# Patient Record
Sex: Male | Born: 1945 | Race: White | Hispanic: No | Marital: Married | Smoking: Former smoker
Health system: Southern US, Community
[De-identification: ages and names within clinical notes are randomized; demographics above are authoritative.]

## PROBLEM LIST (undated history)

## (undated) DIAGNOSIS — I10 Essential (primary) hypertension: Secondary | ICD-10-CM

## (undated) DIAGNOSIS — C61 Malignant neoplasm of prostate: Secondary | ICD-10-CM

## (undated) DIAGNOSIS — R972 Elevated prostate specific antigen [PSA]: Secondary | ICD-10-CM

## (undated) DIAGNOSIS — M19019 Primary osteoarthritis, unspecified shoulder: Secondary | ICD-10-CM

## (undated) DIAGNOSIS — K219 Gastro-esophageal reflux disease without esophagitis: Secondary | ICD-10-CM

## (undated) DIAGNOSIS — H9319 Tinnitus, unspecified ear: Secondary | ICD-10-CM

## (undated) DIAGNOSIS — K635 Polyp of colon: Secondary | ICD-10-CM

## (undated) DIAGNOSIS — Z87442 Personal history of urinary calculi: Secondary | ICD-10-CM

## (undated) HISTORY — PX: HERNIA REPAIR: SHX51

## (undated) HISTORY — PX: CYSTOSCOPY W/ STONE MANIPULATION: SHX1427

## (undated) HISTORY — DX: Gastro-esophageal reflux disease without esophagitis: K21.9

## (undated) HISTORY — PX: BACK SURGERY: SHX140

## (undated) HISTORY — PX: APPENDECTOMY: SHX54

## (undated) HISTORY — PX: OTHER SURGICAL HISTORY: SHX169

## (undated) HISTORY — DX: Essential (primary) hypertension: I10

## (undated) HISTORY — DX: Polyp of colon: K63.5

## (undated) HISTORY — DX: Tinnitus, unspecified ear: H93.19

---

## 1998-03-02 ENCOUNTER — Encounter: Payer: Self-pay | Admitting: Neurosurgery

## 1998-03-02 ENCOUNTER — Ambulatory Visit (HOSPITAL_COMMUNITY): Admission: RE | Admit: 1998-03-02 | Discharge: 1998-03-02 | Payer: Self-pay | Admitting: Neurosurgery

## 1998-11-11 ENCOUNTER — Ambulatory Visit (HOSPITAL_BASED_OUTPATIENT_CLINIC_OR_DEPARTMENT_OTHER): Admission: RE | Admit: 1998-11-11 | Discharge: 1998-11-11 | Payer: Self-pay | Admitting: General Surgery

## 2004-06-22 ENCOUNTER — Ambulatory Visit (HOSPITAL_COMMUNITY): Admission: RE | Admit: 2004-06-22 | Discharge: 2004-06-23 | Payer: Self-pay | Admitting: Orthopaedic Surgery

## 2006-02-15 IMAGING — CR DG CHEST 2V
2 series · 2 of 2 positions shown · non-contrast
Comparison: none

CLINICAL DATA: Severe back pain.  Preop.  Hypertension. Non-smoker. 
 CHEST - TWO VIEW:
 PA and lateral views of the chest are made and show degenerative hypertrophic spurs of the mid thoracic spine.  There is no evidence of fracture or metastatic disease.  The heart, mediastinum and lungs appear clear.

[view not recorded (1 of 2)]
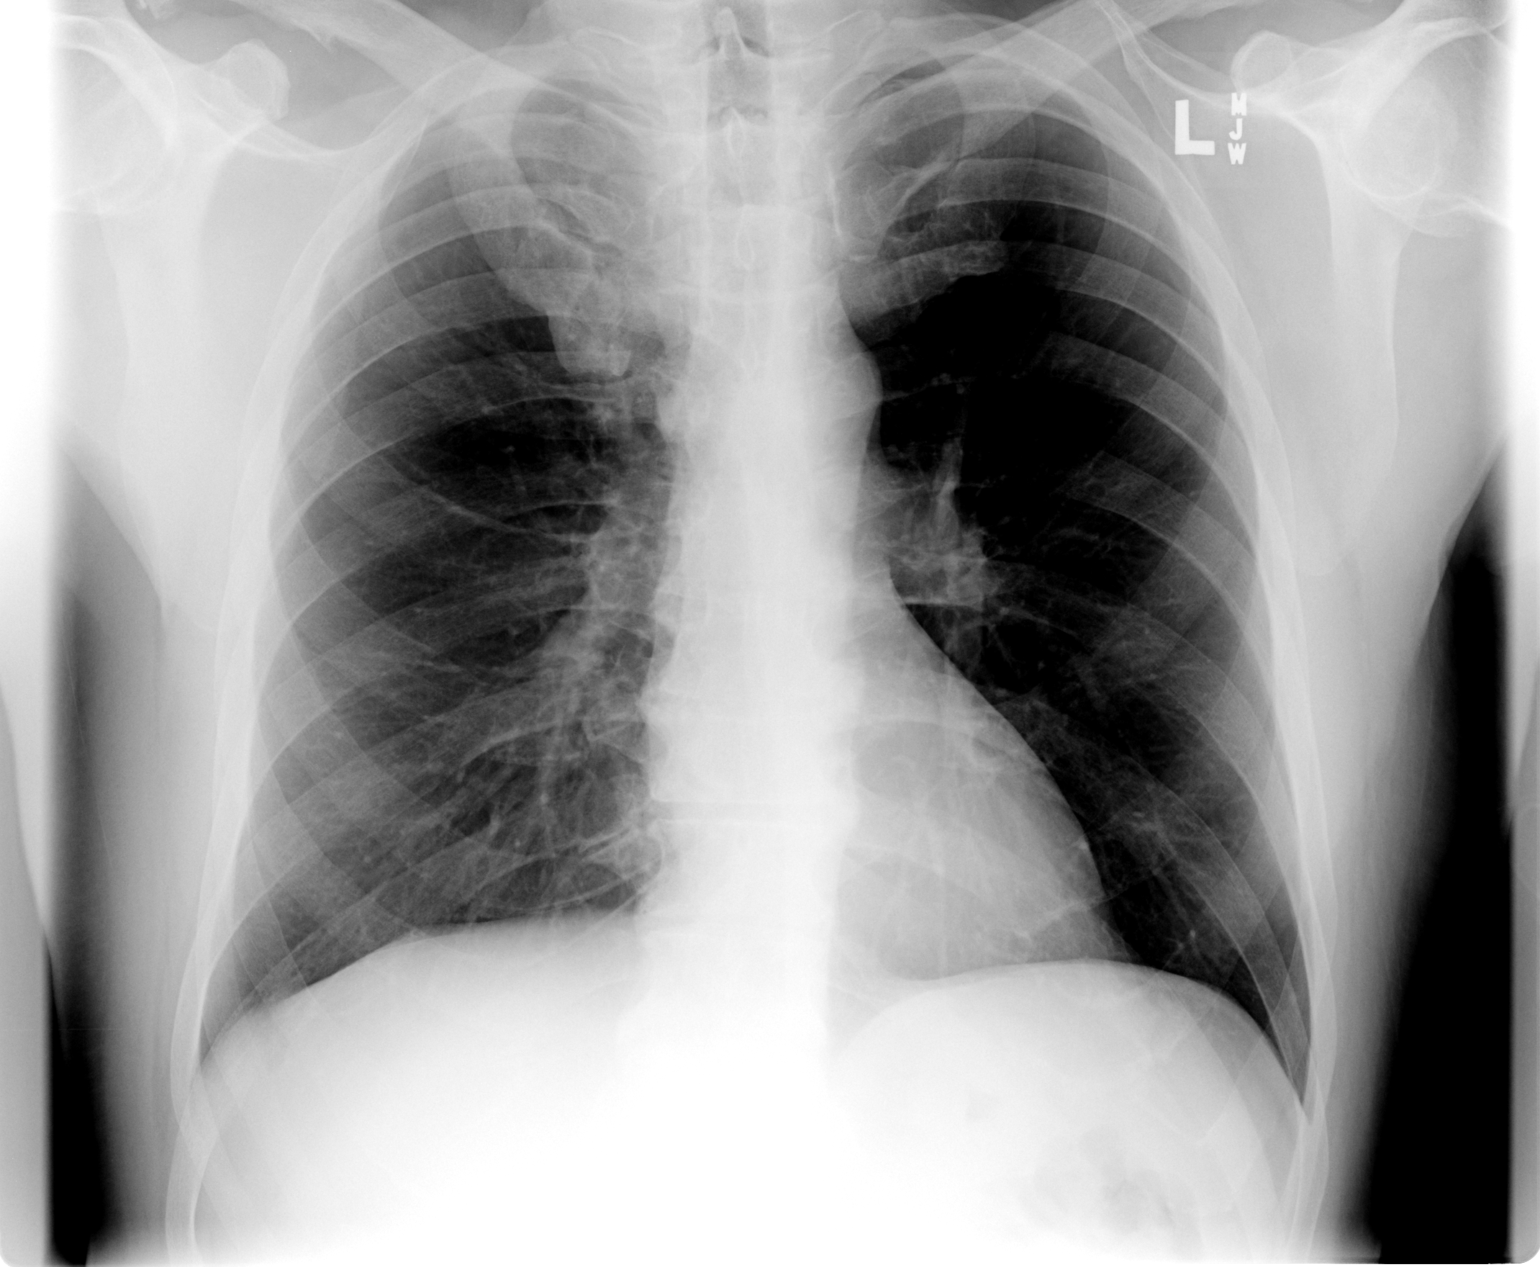

[view not recorded (2 of 2)]
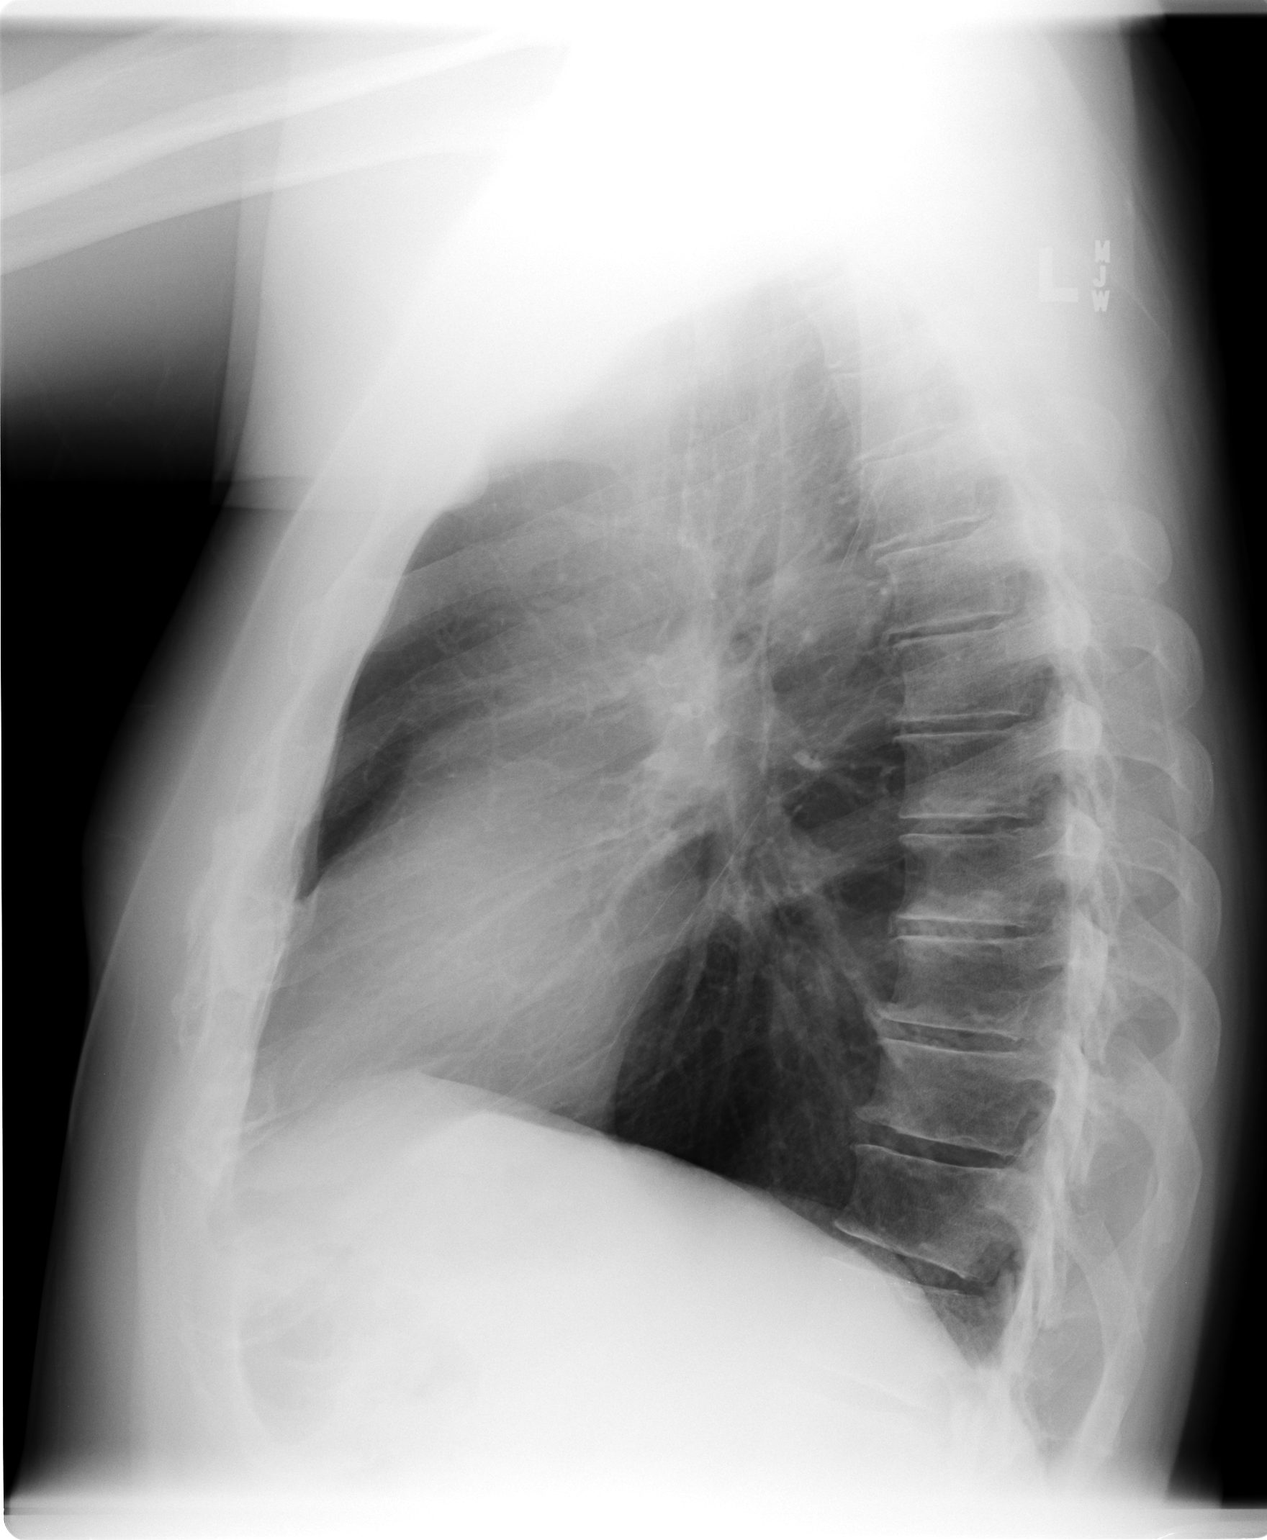

[2 of 2 positions shown; findings below may reference images not displayed]

IMPRESSION: No evidence of active disease in the chest.  Degenerative hypertrophic spurs mid and lower thoracic spine.

## 2006-11-14 ENCOUNTER — Encounter: Admission: RE | Admit: 2006-11-14 | Discharge: 2006-11-14 | Payer: Self-pay | Admitting: Orthopaedic Surgery

## 2006-11-19 ENCOUNTER — Ambulatory Visit (HOSPITAL_COMMUNITY): Admission: RE | Admit: 2006-11-19 | Discharge: 2006-11-20 | Payer: Self-pay | Admitting: Orthopaedic Surgery

## 2008-07-14 IMAGING — CR DG LUMBAR SPINE 1V
1 series · 1 of 1 positions shown · non-contrast
Comparison: none

CLINICAL DATA: Recurrent disk herniation L5-S1.
 LUMBAR SPINE ? 1 VIEW:

[view not recorded]
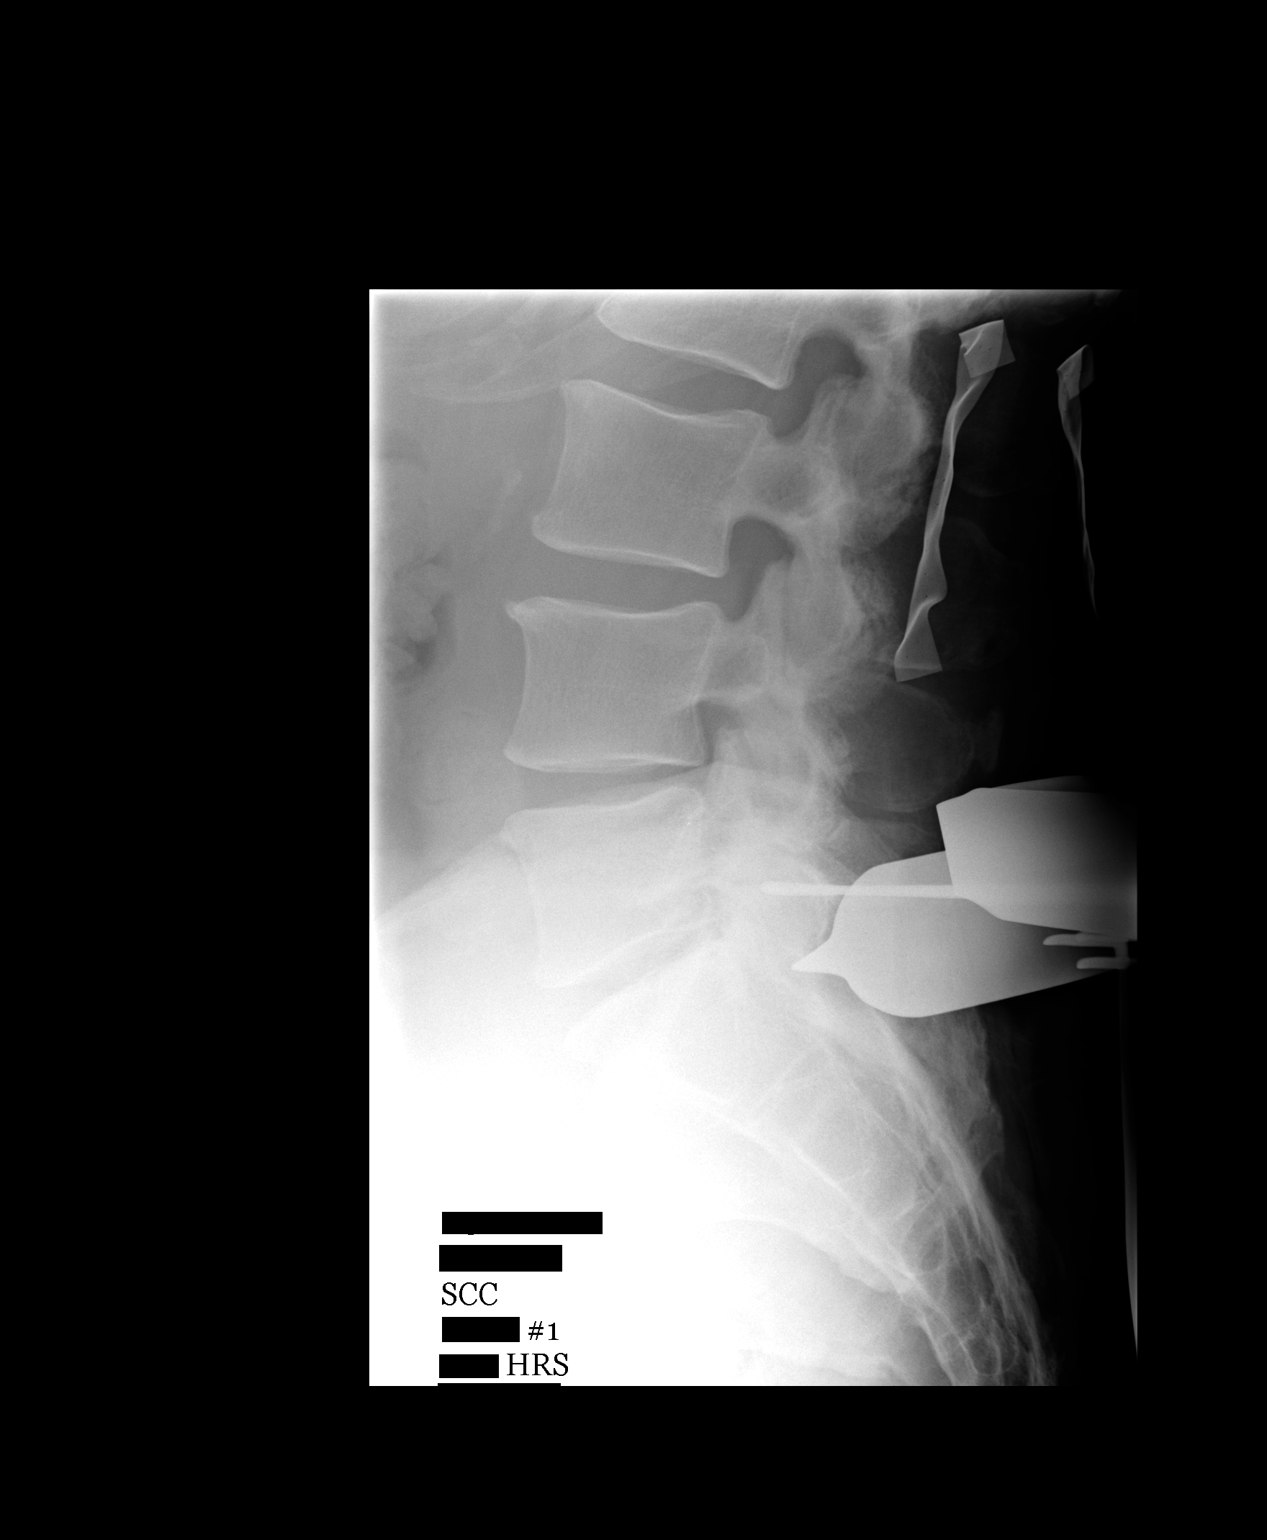

[1 of 1 positions shown; findings below may reference images not displayed]

FINDINGS: Tissue spreaders are in place posteriorly with the probe projected at the L5-S1 disk space level.
IMPRESSION: As discussed above.

## 2010-10-11 NOTE — Op Note (Signed)
Devin Duke, Devin Duke              ACCOUNT NO.:  [id number]   MEDICAL RECORD NO.:  [id number]          PATIENT TYPE:  OIB   LOCATION:  2550                         FACILITY:  [facility]   PHYSICIAN:  Mark C. Ophelia Charter, M.D.    DATE OF BIRTH:  09-06-1945   DATE OF PROCEDURE:  11/19/2006  DATE OF DISCHARGE:                               OPERATIVE REPORT   PREOPERATIVE DIAGNOSIS:  Recurrent L5-S1 herniated nucleus pulposus.   POSTOPERATIVE DIAGNOSIS:  Recurrent L5-S1 herniated nucleus pulposus.   PROCEDURE:  L5-S1 left microdiskectomy for recurrent herniated nucleus  pulposus.   SURGEON:  Annell Greening, MD   ASSISTANT:  Patrick Jupiter, RNFA.   ANESTHESIA:  GOT plus Marcaine skin local.   ESTIMATED BLOOD LOSS:  Minimal.   BRIEF HISTORY:  A 65 year old male who had microdiskectomy in the past;  did well until several weeks ago when he developed severe, excruciating  recurrent pain not responsive to narcotics and inability to sit.  He  also had great difficulty walking, with MRI demonstrating a large  extruded fragment.   PROCEDURE:  After induction of general anesthesia, the patient placed on  the [place] frame.  Standard prepping and draping and time-out  procedure.  Preoperative Ancef prophylaxis.  Sterile skin mark on the  old incision; then Betadine, Vy-Drape and laminectomy sheet.  Incision  was made.  The subcutaneous tissue was sharply dissected with  subperiosteal dissection.  Inferior aspect of the lamina was identified;  a small amount of bone was removed and a Penfield #4 was slipped just  above the level of this; confirmed with cross-table lateral x-ray.  Scar  tissue with the Carlen curettes were cleaned off of the lateral wall,  removing somewhat overhanging facet degenerative changes.  After  removal, the nerve root was identified, it was scarred down into the  foramina; the foramina was enlarged.  The nerve tissue was peeled off  and since the nerve root mobilized slightly, a  large fragment was  identified -- which was freed and was grasped with a pituitary and  gently teased out.  Then the entire piece came out as one large L-shaped  fragment of disk; it was 2 x 5 cm.  After irrigation, a pass was made to  the disk and micro pituitary down and straight.  The foramen was  enlarged, scar tissue peeled off the nerve root and the nerve root was  freed.  No fragments throughout the foramina.  After irrigation with  saline solution then layered closure:  0 Vicryl to the deep fascia, 2-0  Vicryl to subcutaneous tissue, 4-0 Vicryl subcuticular closure.  Tincture of Benzoin and Steri-Strips and postop dressing.  Instrument  count and needle count was correct.     Mark C. Ophelia Charter, M.D.  Electronically Signed    MCY/MEDQ  D:  11/19/2006  T:  11/19/2006  Job:  [id number]

## 2010-10-14 NOTE — Op Note (Signed)
Devin Duke, Devin Duke              ACCOUNT NO.:  [id number]   MEDICAL RECORD NO.:  [id number]          PATIENT TYPE:  OIB   LOCATION:  2899                         FACILITY:  [facility]   PHYSICIAN:  Mark C. Ophelia Charter, M.D.    DATE OF BIRTH:  10/20/1945   DATE OF PROCEDURE:  06/22/2004  DATE OF DISCHARGE:                                 OPERATIVE REPORT   ANESTHESIA:  GOT with Marcaine skin local.   ESTIMATED BLOOD LOSS:  Less than 100 mL.   SURGEON:  Mark C. Ophelia Charter, M.D.   ASSISTANT:  Melvia Heaps, R.N.F.A.   DRAINS:  None.   BRIEF HISTORY:  65 year old male with acute left L5-S1 radiculopathy with  significant pain and difficulty ambulating, paresthesias, and progressive  increasing pain, not relieved or tolerated despite progressive narcotic  intake progressing from Vicodin to Percocet.  MRI scan showed a large L5-S1  HNP on the left with nerve root compression.   PROCEDURE:  After induction of general anesthesia, preoperative Ancef  prophylaxis, the back was prepped with DuraPrep, the area spread with  towels, and Betadine ViDrape was applied.  Laminectomy sheet was applied.  An incision was made starting at the L5-S1 level after a spinal needle was  placed at the expected level based on palpable landmarks and confirmed with  cross table lateral x-ray.  An incision was made at L5-S1 and subperiosteal  dissection of the lamina was performed, Taylor retractor was placed  laterally.  A small laminotomy was performed at L5.  Some lateral bone was  removed out to the level of the pedicle.  There was immediate visualization  of the L5 nerve root which was significantly displaced dorsally.  The nerve  root was significantly compressed and disc immediately  underneath it which  was ruptured and scarred down to the nerve root with a film of fibrous  tissue.  This was gently dissected away superiorly and ventrally with black  nerve hook.  Finally, a small piece of the disc was identified and  then the  capsule was gently broken loose laterally and the plane between the nerve  root and the encapsulated disc material was separated.  A large disc  fragment was removed with some remaining small fragments still left in the  pocket.  The wall of the capsule was removed.  The annulus was incised.  Passes were made and one remaining large chunk of nucleus material was  removed.  Foramina was freed, there was no remaining fragments.  Additional  passes were made with micropituitaries up and down with Epstein curet and  also a hockey stick was used for palpation.  There were no remaining  fragments present.  After irrigation with saline solution, Taylor retractor  was removed from the lateral position, and the fascia was closed with 0  Vicryl suture, 2-0 Vicryl in the  subcutaneous tissue, 4-0 Vicryl subcuticular skin closure, tincture of  Benzoin and Steri-Strips.  Marcaine infiltration.  4 by 4s and tape were  applied.  The patient tolerated the procedure well and was transported to  the recovery room in stable condition.  MCY/MEDQ  D:  06/22/2004  T:  06/22/2004  Job:  [id number]

## 2011-03-15 LAB — CBC
Hemoglobin: 16.4
MCHC: 33.7
MCV: 94.2
RBC: 5.17

## 2011-03-15 LAB — URINALYSIS, ROUTINE W REFLEX MICROSCOPIC
Ketones, ur: NEGATIVE
Nitrite: NEGATIVE
Specific Gravity, Urine: 1.024
pH: 6.5

## 2011-03-15 LAB — COMPREHENSIVE METABOLIC PANEL
BUN: 22
CO2: 31
Calcium: 9.6
Creatinine, Ser: 0.84
GFR calc non Af Amer: >60
Glucose, Bld: 126 — ABNORMAL HIGH

## 2011-03-15 LAB — BODY FLUID CULTURE

## 2014-01-25 ENCOUNTER — Encounter: Payer: Self-pay | Admitting: *Deleted

## 2014-01-25 DIAGNOSIS — I10 Essential (primary) hypertension: Secondary | ICD-10-CM | POA: Insufficient documentation

## 2015-08-11 ENCOUNTER — Other Ambulatory Visit: Payer: Self-pay | Admitting: Gastroenterology

## 2015-09-08 ENCOUNTER — Encounter (HOSPITAL_COMMUNITY): Payer: Self-pay | Admitting: *Deleted

## 2015-09-20 ENCOUNTER — Encounter (HOSPITAL_COMMUNITY): Payer: Self-pay

## 2015-09-20 ENCOUNTER — Ambulatory Visit (HOSPITAL_COMMUNITY): Payer: Medicare Other | Admitting: Certified Registered Nurse Anesthetist

## 2015-09-20 ENCOUNTER — Ambulatory Visit (HOSPITAL_COMMUNITY)
Admission: RE | Admit: 2015-09-20 | Discharge: 2015-09-20 | Disposition: A | Discharge status: Home / Self Care | Payer: Medicare Other | Source: Ambulatory Visit | Attending: Gastroenterology | Admitting: Gastroenterology

## 2015-09-20 ENCOUNTER — Encounter (HOSPITAL_COMMUNITY)
Admission: RE | Disposition: A | Discharge status: Home / Self Care | Payer: Self-pay | Source: Ambulatory Visit | Attending: Gastroenterology

## 2015-09-20 DIAGNOSIS — D125 Benign neoplasm of sigmoid colon: Secondary | ICD-10-CM | POA: Insufficient documentation

## 2015-09-20 DIAGNOSIS — K635 Polyp of colon: Secondary | ICD-10-CM | POA: Insufficient documentation

## 2015-09-20 DIAGNOSIS — Z1211 Encounter for screening for malignant neoplasm of colon: Secondary | ICD-10-CM | POA: Diagnosis present

## 2015-09-20 DIAGNOSIS — Z87891 Personal history of nicotine dependence: Secondary | ICD-10-CM | POA: Diagnosis not present

## 2015-09-20 DIAGNOSIS — Z8719 Personal history of other diseases of the digestive system: Secondary | ICD-10-CM | POA: Insufficient documentation

## 2015-09-20 DIAGNOSIS — D122 Benign neoplasm of ascending colon: Secondary | ICD-10-CM | POA: Diagnosis not present

## 2015-09-20 DIAGNOSIS — I10 Essential (primary) hypertension: Secondary | ICD-10-CM | POA: Diagnosis not present

## 2015-09-20 HISTORY — DX: Elevated prostate specific antigen (PSA): R97.20

## 2015-09-20 HISTORY — PX: COLONOSCOPY WITH PROPOFOL: SHX5780

## 2015-09-20 HISTORY — DX: Primary osteoarthritis, unspecified shoulder: M19.019

## 2015-09-20 HISTORY — DX: Personal history of urinary calculi: Z87.442

## 2015-09-20 SURGERY — COLONOSCOPY WITH PROPOFOL
Anesthesia: Monitor Anesthesia Care

## 2015-09-20 MED ORDER — LIDOCAINE HCL (CARDIAC) 20 MG/ML IV SOLN
INTRAVENOUS | Status: AC
  Filled 2015-09-20: qty 5

## 2015-09-20 MED ORDER — ONDANSETRON HCL 4 MG/2ML IJ SOLN
INTRAMUSCULAR | Status: AC
  Filled 2015-09-20: qty 2

## 2015-09-20 MED ORDER — PROPOFOL 500 MG/50ML IV EMUL
INTRAVENOUS | Status: DC | PRN
  Administered 2015-09-20: 75 ug/kg/min via INTRAVENOUS

## 2015-09-20 MED ORDER — LACTATED RINGERS IV SOLN
INTRAVENOUS | Status: DC
  Administered 2015-09-20: 1000 mL via INTRAVENOUS

## 2015-09-20 MED ORDER — FENTANYL CITRATE (PF) 100 MCG/2ML IJ SOLN: 25.0000 ug | INTRAMUSCULAR | Status: DC | PRN

## 2015-09-20 MED ORDER — PROPOFOL 10 MG/ML IV BOLUS
INTRAVENOUS | Status: AC
  Filled 2015-09-20: qty 60

## 2015-09-20 MED ORDER — LIDOCAINE HCL (CARDIAC) 20 MG/ML IV SOLN
INTRAVENOUS | Status: DC | PRN
  Administered 2015-09-20: 100 mg via INTRAVENOUS

## 2015-09-20 MED ORDER — PROPOFOL 10 MG/ML IV BOLUS
INTRAVENOUS | Status: DC | PRN
  Administered 2015-09-20 (×3): 20 mg via INTRAVENOUS
  Administered 2015-09-20: 10 mg via INTRAVENOUS
  Administered 2015-09-20: 20 mg via INTRAVENOUS

## 2015-09-20 MED ORDER — SODIUM CHLORIDE 0.9 % IV SOLN: INTRAVENOUS | Status: DC

## 2015-09-20 MED ORDER — ONDANSETRON HCL 4 MG/2ML IJ SOLN
INTRAMUSCULAR | Status: DC | PRN
  Administered 2015-09-20: 4 mg via INTRAVENOUS

## 2015-09-20 MED ORDER — ONDANSETRON HCL 4 MG/2ML IJ SOLN: 4.0000 mg | Freq: Once | INTRAMUSCULAR | Status: DC | PRN

## 2015-09-20 SURGICAL SUPPLY — 22 items

## 2015-09-20 NOTE — Anesthesia Postprocedure Evaluation (Signed)
Anesthesia Post Note  Patient: Devin Duke  Procedure(s) Performed: Procedure(s) (LRB): COLONOSCOPY WITH PROPOFOL (N/A)  Patient location during evaluation: PACU Anesthesia Type: MAC Level of consciousness: awake and alert Pain management: pain level controlled Vital Signs Assessment: post-procedure vital signs reviewed and stable Respiratory status: spontaneous breathing, nonlabored ventilation, respiratory function stable and patient connected to nasal cannula oxygen Cardiovascular status: stable and blood pressure returned to baseline Anesthetic complications: no    Last Vitals:  Filed Vitals:   09/20/15 1349 09/20/15 1410  BP:    Pulse: 52   Temp:    Resp: 11 18    Last Pain: There were no vitals filed for this visit.               Zenaida Deed

## 2015-09-20 NOTE — Anesthesia Preprocedure Evaluation (Signed)
Anesthesia Evaluation  Patient identified by MRN, date of birth, ID band Patient awake    Reviewed: Allergy & Precautions, H&P , NPO status , Patient's Chart, lab work & pertinent test results  History of Anesthesia Complications Negative for: history of anesthetic complications  Airway Mallampati: II  TM Distance: >3 FB Neck ROM: full    Dental no notable dental hx.    Pulmonary former smoker,    Pulmonary exam normal breath sounds clear to auscultation       Cardiovascular hypertension, On Medications Normal cardiovascular exam Rhythm:regular Rate:Normal     Neuro/Psych negative neurological ROS     GI/Hepatic Neg liver ROS, GERD  ,  Endo/Other  negative endocrine ROS  Renal/GU negative Renal ROS     Musculoskeletal   Abdominal   Peds  Hematology negative hematology ROS (+)   Anesthesia Other Findings   Reproductive/Obstetrics negative OB ROS                             Anesthesia Physical Anesthesia Plan  ASA: II  Anesthesia Plan: MAC   Post-op Pain Management:    Induction: Intravenous  Airway Management Planned:   Additional Equipment:   Intra-op Plan:   Post-operative Plan:   Informed Consent: I have reviewed the patients History and Physical, chart, labs and discussed the procedure including the risks, benefits and alternatives for the proposed anesthesia with the patient or authorized representative who has indicated his/her understanding and acceptance.   Dental Advisory Given  Plan Discussed with: Anesthesiologist, CRNA and Surgeon  Anesthesia Plan Comments:         Anesthesia Quick Evaluation

## 2015-09-20 NOTE — Op Note (Signed)
[facility] Patient Name: Devin Duke Procedure Date: 09/20/2015 MRN: [id number] Attending MD: Garlan Fair , MD Date of Birth: 12/07/1945 CSN:  Age: 70 Admit Type: Outpatient Procedure:                Colonoscopy Indications:              High risk colon cancer surveillance: Personal                            history of adenoma less than 10 mm in size Providers:                Garlan Fair, MD, Tory Emerald, RN, Despina Pole, Technician Referring MD:              Medicines:                Propofol per Anesthesia Complications:            No immediate complications. Estimated Blood Loss:     Estimated blood loss: none. Procedure:                Pre-Anesthesia Assessment:                           - Prior to the procedure, a History and Physical                            was performed, and patient medications and                            allergies were reviewed. The patient's tolerance of                            previous anesthesia was also reviewed. The risks                            and benefits of the procedure and the sedation                            options and risks were discussed with the patient.                            All questions were answered, and informed consent                            was obtained. Prior Anticoagulants: The patient has                            taken aspirin, last dose was day of procedure. ASA                            Grade Assessment: II - A patient with mild systemic  disease. After reviewing the risks and benefits,                            the patient was deemed in satisfactory condition to                            undergo the procedure.                           After obtaining informed consent, the colonoscope                            was passed under direct vision. Throughout the                            procedure, the patient's blood  pressure, pulse, and                            oxygen saturations were monitored continuously. The                            Colonoscope was introduced through the anus and                            advanced to the the cecum, identified by                            appendiceal orifice and ileocecal valve. The                            colonoscopy was performed without difficulty. The                            patient tolerated the procedure well. The quality                            of the bowel preparation was good. The ileocecal                            valve, the appendiceal orifice and the rectum were                            photographed. Scope In: 1:26:05 PM Scope Out: 1:43:42 PM Scope Withdrawal Time: 0 hours 11 minutes 11 seconds  Total Procedure Duration: 0 hours 17 minutes 37 seconds  Findings:      The perianal and digital rectal examinations were normal.      A 3 mm polyp was found in the proximal ascending colon. The polyp was       sessile. The polyp was removed with a cold biopsy forceps. Resection and       retrieval were complete.      A 5 mm polyp was found in the proximal ascending colon. The polyp was       sessile. The polyp was removed with a cold snare. Resection and  retrieval were complete.      A 3 mm polyp was found in the distal sigmoid colon. The polyp was       sessile. The polyp was removed with a cold biopsy forceps. Resection and       retrieval were complete.      The exam was otherwise without abnormality. Impression:               - One 3 mm polyp in the proximal ascending colon,                            removed with a cold biopsy forceps. Resected and                            retrieved.                           - One 5 mm polyp in the proximal ascending colon,                            removed with a cold snare. Resected and retrieved.                           - One 3 mm polyp in the distal sigmoid colon,                             removed with a cold biopsy forceps. Resected and                            retrieved.                           - The examination was otherwise normal. Moderate Sedation:      N/A- Per Anesthesia Care Recommendation:           - Patient has a contact number available for                            emergencies. The signs and symptoms of potential                            delayed complications were discussed with the                            patient. Return to normal activities tomorrow.                            Written discharge instructions were provided to the                            patient.                           - Repeat colonoscopy date to be determined after  pending pathology results are reviewed for                            surveillance.                           - Resume previous diet.                           - Continue present medications. Procedure Code(s):        --- Professional ---                           [id number], Colonoscopy, flexible; with removal of                            tumor(s), polyp(s), or other lesion(s) by snare                            technique                           45380, 22, Colonoscopy, flexible; with biopsy,                            single or multiple Diagnosis Code(s):        --- Professional ---                           Z86.010, Personal history of colonic polyps                           D12.2, Benign neoplasm of ascending colon                           D12.5, Benign neoplasm of sigmoid colon CPT copyright 2016 American Medical [employer]. All rights reserved. The codes documented in this report are preliminary and upon coder review may  be revised to meet current compliance requirements. Earle Gell, MD Garlan Fair, MD 09/20/2015 1:51:17 PM This report has been signed electronically. Number of Addenda: 0

## 2015-09-20 NOTE — Transfer of Care (Signed)
Immediate Anesthesia Transfer of Care Note  Patient: Devin Duke  Procedure(s) Performed: Procedure(s): COLONOSCOPY WITH PROPOFOL (N/A)  Patient Location: ENDO  Anesthesia Type:MAC  Level of Consciousness:  sedated, patient cooperative and responds to stimulation  Airway & Oxygen Therapy:Patient Spontanous Breathing and Patient connected to face mask oxgen  Post-op Assessment:  Report given to ENDO RN and Post -op Vital signs reviewed and stable  Post vital signs:  Reviewed and stable  Last Vitals:  Filed Vitals:   09/20/15 1235  BP: 150/61  Pulse: 54  Temp: 123XX123 C    Complications: No apparent anesthesia complications

## 2015-09-20 NOTE — H&P (Signed)
  Procedure: Surveillance colonoscopy. Normal surveillance colonoscopy performed on 05/10/2010. Adenomatous rectal polyp removed colonoscopically in 2006.  History: The patient is a 70 year old male born 1945/12/04. He is scheduled to undergo a surveillance colonoscopy today.  Past medical history: Appendectomy. Lumbar laminectomy. Kidney stone. Hernia repair. Hypertension.  Medication allergies: Penicillin  Exam: The patient is alert and lying comfortably on the endoscopy stretcher. Abdomen is soft and nontender to palpation. Lungs are clear to auscultation. Cardiac exam reveals a regular rhythm.  Plan: Proceed with surveillance colonoscopy

## 2015-09-20 NOTE — Discharge Instructions (Signed)

## 2015-09-21 ENCOUNTER — Encounter (HOSPITAL_COMMUNITY): Payer: Self-pay | Admitting: Gastroenterology

## 2019-06-24 ENCOUNTER — Ambulatory Visit: Payer: Medicare Other

## 2020-02-04 ENCOUNTER — Other Ambulatory Visit: Payer: Self-pay

## 2020-02-04 DIAGNOSIS — Z20822 Contact with and (suspected) exposure to covid-19: Secondary | ICD-10-CM

## 2020-02-06 LAB — NOVEL CORONAVIRUS, NAA: SARS-CoV-2, NAA: NOT DETECTED

## 2020-02-06 LAB — SARS-COV-2, NAA 2 DAY TAT

## 2020-02-10 ENCOUNTER — Ambulatory Visit: Payer: Self-pay | Attending: Internal Medicine

## 2020-02-10 DIAGNOSIS — Z23 Encounter for immunization: Secondary | ICD-10-CM

## 2020-02-10 NOTE — Progress Notes (Signed)
° °  Covid-19 Vaccination Clinic  Name:  AMANUEL SINKFIELD    MRN: [id number] DOB: 09-13-1945  02/10/2020  Mr. Berges was observed post Covid-19 immunization for 15 minutes without incident. He was provided with Vaccine Information Sheet and instruction to access the V-Safe system.   Mr. Maland was instructed to call 911 with any severe reactions post vaccine:  Difficulty breathing   Swelling of face and throat   A fast heartbeat   A bad rash all over body   Dizziness and weakness

## 2021-07-12 ENCOUNTER — Encounter: Payer: Self-pay | Admitting: Ophthalmology

## 2021-07-18 NOTE — Discharge Instructions (Signed)

## 2021-07-20 ENCOUNTER — Ambulatory Visit: Payer: Medicare PPO | Admitting: Anesthesiology

## 2021-07-20 ENCOUNTER — Encounter: Payer: Self-pay | Admitting: Ophthalmology

## 2021-07-20 ENCOUNTER — Encounter
Admission: RE | Disposition: A | Discharge status: Home / Self Care | Payer: Self-pay | Source: Home / Self Care | Attending: Ophthalmology

## 2021-07-20 ENCOUNTER — Other Ambulatory Visit: Payer: Self-pay

## 2021-07-20 ENCOUNTER — Ambulatory Visit
Admission: RE | Admit: 2021-07-20 | Discharge: 2021-07-20 | Disposition: A | Discharge status: Home / Self Care | Payer: Medicare PPO | Attending: Ophthalmology | Admitting: Ophthalmology

## 2021-07-20 DIAGNOSIS — Z87891 Personal history of nicotine dependence: Secondary | ICD-10-CM | POA: Insufficient documentation

## 2021-07-20 DIAGNOSIS — H2511 Age-related nuclear cataract, right eye: Secondary | ICD-10-CM | POA: Insufficient documentation

## 2021-07-20 DIAGNOSIS — I10 Essential (primary) hypertension: Secondary | ICD-10-CM | POA: Diagnosis not present

## 2021-07-20 HISTORY — PX: CATARACT EXTRACTION W/PHACO: SHX586

## 2021-07-20 HISTORY — DX: Malignant neoplasm of prostate: C61

## 2021-07-20 SURGERY — PHACOEMULSIFICATION, CATARACT, WITH IOL INSERTION
Anesthesia: Monitor Anesthesia Care | Site: Eye | Laterality: Right

## 2021-07-20 MED ORDER — SIGHTPATH DOSE#1 BSS IO SOLN
INTRAOCULAR | Status: DC | PRN
Start: 2021-07-20 — End: 2021-07-20
  Administered 2021-07-20: 15 mL

## 2021-07-20 MED ORDER — TETRACAINE HCL 0.5 % OP SOLN
1.0000 [drp] | OPHTHALMIC | Status: DC | PRN
  Administered 2021-07-20 (×3): 1 [drp] via OPHTHALMIC

## 2021-07-20 MED ORDER — LACTATED RINGERS IV SOLN: INTRAVENOUS | Status: DC

## 2021-07-20 MED ORDER — BRIMONIDINE TARTRATE-TIMOLOL 0.2-0.5 % OP SOLN
OPHTHALMIC | Status: DC | PRN
  Administered 2021-07-20: 1 [drp] via OPHTHALMIC

## 2021-07-20 MED ORDER — FENTANYL CITRATE (PF) 100 MCG/2ML IJ SOLN
INTRAMUSCULAR | Status: DC | PRN
  Administered 2021-07-20: 50 ug via INTRAVENOUS

## 2021-07-20 MED ORDER — ARMC OPHTHALMIC DILATING DROPS
1.0000 "application " | OPHTHALMIC | Status: DC | PRN
  Administered 2021-07-20 (×3): 1 via OPHTHALMIC

## 2021-07-20 MED ORDER — SIGHTPATH DOSE#1 NA HYALUR & NA CHOND-NA HYALUR IO KIT
PACK | INTRAOCULAR | Status: DC | PRN
  Administered 2021-07-20: 1 via OPHTHALMIC

## 2021-07-20 MED ORDER — MOXIFLOXACIN HCL 0.5 % OP SOLN
OPHTHALMIC | Status: DC | PRN
  Administered 2021-07-20: 0.2 mL via OPHTHALMIC

## 2021-07-20 MED ORDER — LIDOCAINE HCL (PF) 2 % IJ SOLN
INTRAMUSCULAR | Status: DC | PRN
  Administered 2021-07-20: 1 mL via INTRAMUSCULAR

## 2021-07-20 MED ORDER — MIDAZOLAM HCL 2 MG/2ML IJ SOLN
INTRAMUSCULAR | Status: DC | PRN
Start: 2021-07-20 — End: 2021-07-20
  Administered 2021-07-20: 2 mg via INTRAVENOUS

## 2021-07-20 SURGICAL SUPPLY — 12 items
CATARACT SUITE SIGHTPATH (MISCELLANEOUS) ×2 IMPLANT
FEE CATARACT SUITE SIGHTPATH (MISCELLANEOUS) ×1 IMPLANT
GLOVE SRG 8 PF TXTR STRL LF DI (GLOVE) ×1 IMPLANT
GLOVE SURG ENC TEXT LTX SZ7.5 (GLOVE) ×2 IMPLANT
GLOVE SURG UNDER POLY LF SZ8 (GLOVE) ×2
LENS IOL TECNIS EYHANCE 22.0 (Intraocular Lens) ×1 IMPLANT
NDL FILTER BLUNT 18X1 1/2 (NEEDLE) ×1 IMPLANT
NEEDLE FILTER BLUNT 18X 1/2SAF (NEEDLE) ×1
NEEDLE FILTER BLUNT 18X1 1/2 (NEEDLE) ×1 IMPLANT
RING MALYGIN 7.0 (MISCELLANEOUS) ×1 IMPLANT
SYR 3ML LL SCALE MARK (SYRINGE) ×2 IMPLANT
WATER STERILE IRR 250ML POUR (IV SOLUTION) ×2 IMPLANT

## 2021-07-20 NOTE — Anesthesia Postprocedure Evaluation (Signed)
Anesthesia Post Note  Patient: Devin Duke  Procedure(s) Performed: CATARACT EXTRACTION PHACO AND INTRAOCULAR LENS PLACEMENT (IOC) RIGHT (Right: Eye)     Patient location during evaluation: PACU Anesthesia Type: MAC Level of consciousness: awake and alert and oriented Pain management: satisfactory to patient Vital Signs Assessment: post-procedure vital signs reviewed and stable Respiratory status: spontaneous breathing, nonlabored ventilation and respiratory function stable Cardiovascular status: blood pressure returned to baseline and stable Postop Assessment: Adequate PO intake and No signs of nausea or vomiting Anesthetic complications: no   No notable events documented.  Raliegh Ip

## 2021-07-20 NOTE — H&P (Signed)
[facility]   Primary Care Physician:  Adin Hector, MD Ophthalmologist: Dr. Leandrew Koyanagi  Pre-Procedure History & Physical: HPI:  Devin Duke is a 76 y.o. male here for ophthalmic surgery.   Past Medical History:  Diagnosis Date   Colon polyp    GERD (gastroesophageal reflux disease)    History of kidney stones    x1    Hypertension    Prostate CA (HCC)    PSA elevation    slow increase-[facility]   Shoulder arthritis    limited ROM/pain   Tinnitus     Past Surgical History:  Procedure Laterality Date    orif arm Left    APPENDECTOMY     BACK SURGERY     x2 back   COLONOSCOPY WITH PROPOFOL N/A 09/20/2015   Procedure: COLONOSCOPY WITH PROPOFOL;  Surgeon: Garlan Fair, MD;  Location: [facility];  Service: Endoscopy;  Laterality: N/A;   CYSTOSCOPY W/ STONE MANIPULATION     HERNIA REPAIR     inguinal hernia repair    Prior to Admission medications   Medication Sig Start Date End Date Taking? Authorizing Provider  amLODipine-benazepril (LOTREL) 5-20 MG per capsule Take 1 capsule by mouth every morning.    Yes [provider]  Multiple Vitamins-Minerals (PRESERVISION AREDS 2 PO) Take by mouth.   Yes [provider]    Allergies as of 06/14/2021 - Review Complete 09/20/2015  Allergen Reaction Noted   Penicillins  01/25/2014    Family History  Family history unknown: Yes    Social History   Socioeconomic History   Marital status: Married    Spouse name: Not on file   Number of children: Not on file   Years of education: Not on file   Highest education level: Not on file  Occupational History   Not on file  Tobacco Use   Smoking status: Former    Types: Cigarettes    Quit date: 09/07/1980    Years since quitting: 40.8   Smokeless tobacco: Not on file  Vaping Use   Vaping Use: Never used  Substance and Sexual Activity   Alcohol use: Yes    Alcohol/week: 4.0 standard  drinks    Types: 4 Standard drinks or equivalent per week    Comment: liquor 2-4 drink/ week   Drug use: No   Sexual activity: Not on file  Other Topics Concern   Not on file  Social History Narrative   Not on file   Social Determinants of Health   Financial Resource Strain: Not on file  Food Insecurity: Not on file  Transportation Needs: Not on file  Physical Activity: Not on file  Stress: Not on file  Social Connections: Not on file  Intimate Partner Violence: Not on file    Review of Systems: See HPI, otherwise negative ROS  Physical Exam: BP (!) 142/67    Pulse (!) 50    Temp 98.1 F (36.7 C) (Temporal)    Resp 14    Ht 6\' 1"  (1.854 m)    Wt 86.6 kg    SpO2 96%    BMI 25.20 kg/m  General:   Alert,  pleasant and cooperative in NAD Head:  Normocephalic and atraumatic. Lungs:  Clear to auscultation.    Heart:  Regular rate and rhythm.   Impression/Plan: Devin Duke is here for ophthalmic surgery.  Risks, benefits, limitations, and alternatives regarding ophthalmic surgery have been reviewed  with the patient.  Questions have been answered.  All parties agreeable.   Leandrew Koyanagi, MD  07/20/2021, 8:35 AM

## 2021-07-20 NOTE — Transfer of Care (Signed)
Immediate Anesthesia Transfer of Care Note  Patient: Devin Duke  Procedure(s) Performed: CATARACT EXTRACTION PHACO AND INTRAOCULAR LENS PLACEMENT (IOC) RIGHT (Right: Eye)  Patient Location: PACU  Anesthesia Type: MAC  Level of Consciousness: awake, alert  and patient cooperative  Airway and Oxygen Therapy: Patient Spontanous Breathing and Patient connected to supplemental oxygen  Post-op Assessment: Post-op Vital signs reviewed, Patient's Cardiovascular Status Stable, Respiratory Function Stable, Patent Airway and No signs of Nausea or vomiting  Post-op Vital Signs: Reviewed and stable  Complications: No notable events documented.

## 2021-07-20 NOTE — Anesthesia Preprocedure Evaluation (Signed)
Anesthesia Evaluation  ?Patient identified by MRN, date of birth, ID band ?Patient awake ? ? ? ?Reviewed: ?Allergy & Precautions, H&P , NPO status , Patient's Chart, lab work & pertinent test results ? ?Airway ?Mallampati: II ? ?TM Distance: >3 FB ?Neck ROM: full ? ? ? Dental ?no notable dental hx. ? ?  ?Pulmonary ?former smoker,  ?  ?Pulmonary exam normal ?breath sounds clear to auscultation ? ? ? ? ? ? Cardiovascular ?hypertension, Normal cardiovascular exam ?Rhythm:regular Rate:Normal ? ? ?  ?Neuro/Psych ?  ? GI/Hepatic ?  ?Endo/Other  ? ? Renal/GU ?  ? ?  ?Musculoskeletal ? ? Abdominal ?  ?Peds ? Hematology ?  ?Anesthesia Other Findings ? ? Reproductive/Obstetrics ? ?  ? ? ? ? ? ? ? ? ? ? ? ? ? ?  ?  ? ? ? ? ? ? ? ? ?Anesthesia Physical ?Anesthesia Plan ? ?ASA: 2 ? ?Anesthesia Plan: MAC  ? ?Post-op Pain Management: Minimal or no pain anticipated  ? ?Induction:  ? ?PONV Risk Score and Plan: 1 and Treatment may vary due to age or medical condition, TIVA and Midazolam ? ?Airway Management Planned:  ? ?Additional Equipment:  ? ?Intra-op Plan:  ? ?Post-operative Plan:  ? ?Informed Consent: I have reviewed the patients History and Physical, chart, labs and discussed the procedure including the risks, benefits and alternatives for the proposed anesthesia with the patient or authorized representative who has indicated his/her understanding and acceptance.  ? ? ? ?Dental Advisory Given ? ?Plan Discussed with: CRNA ? ?Anesthesia Plan Comments:   ? ? ? ? ? ? ?Anesthesia Quick Evaluation ? ?

## 2021-07-20 NOTE — Op Note (Signed)
OPERATIVE NOTE  Devin Duke [id number] 07/20/2021   PREOPERATIVE DIAGNOSIS:    Nuclear Sclerotic Cataract Right eye with miotic pupil.        H25.11  POSTOPERATIVE DIAGNOSIS: Nuclear Sclerotic Cataract Right eye with miotic pupil.          PROCEDURE:  Phacoemusification with posterior chamber intraocular lens placement of the right eye which required pupil stretching with the Malyugin pupil expansion device. Ultrasound time: Procedure(s) with comments: CATARACT EXTRACTION PHACO AND INTRAOCULAR LENS PLACEMENT (IOC) RIGHT (Right) - 8.06 01:15.6  LENS:   Implant Name Type Inv. Item Serial No. Manufacturer Lot No. LRB No. Used Action  LENS IOL TECNIS EYHANCE 22.0 - [id number] Intraocular Lens LENS IOL TECNIS EYHANCE 22.0 [id number] [employer]  Right 1 Implanted       SURGEON:  Wyonia Hough, MD   ANESTHESIA:  Topical with tetracaine drops and 2% Xylocaine jelly, augmented with 1% preservative-free intracameral lidocaine.   COMPLICATIONS:  None.   DESCRIPTION OF PROCEDURE:  The patient was identified in the holding room and transported to the operating room and placed in the supine position under the operating microscope. Theright eye was identified as the operative eye and it was prepped and draped in the usual sterile ophthalmic fashion.   A 1 millimeter clear-corneal paracentesis was made at the 12:00 position.  0.5 ml of preservative-free 1% lidocaine was injected into the anterior chamber. The anterior chamber was filled with Viscoat viscoelastic.  A 2.4 millimeter keratome was used to make a near-clear corneal incision at the 9:00 position. A Malyugin pupil expander was then placed through the main incision and into the anterior chamber of the eye.  The edge of the iris was secured on the lip of the pupil expander and it was released, thereby expanding the pupil to approximately 7 millimeters for completion of the cataract surgery.  Additional Viscoat was placed in the  anterior chamber.  A cystotome and capsulorrhexis forceps were used to make a curvilinear capsulorrhexis.   Balanced salt solution was used to hydrodissect and hydrodelineate the lens nucleus.   Phacoemulsification was used in stop and chop fashion to remove the lens, nucleus and epinucleus.  The remaining cortex was aspirated using the irrigation aspiration handpiece.  Additional Provisc was placed into the eye to distend the capsular bag for lens placement.  A lens was then injected into the capsular bag.  The pupil expanding ring was removed using a Kuglen hook and insertion device. The remaining viscoelastic was aspirated from the capsular bag and the anterior chamber.  The anterior chamber was filled with balanced salt solution to inflate to a physiologic pressure.  Wounds were hydrated with balanced salt solution.  The anterior chamber was inflated to a physiologic pressure with balanced salt solution.  No wound leaks were noted.Vigamox 0.2 ml of a 1mg  per ml solution was injected into the anterior chamber for a dose of 0.2 mg of intracameral antibiotic at the completion of the case. Timolol and Brimonidine drops were applied to the eye.  The patient was taken to the recovery room in stable condition without complications of anesthesia or surgery.  [name] 07/20/2021, 9:28 AM

## 2021-07-21 ENCOUNTER — Encounter: Payer: Self-pay | Admitting: Ophthalmology

## 2021-07-26 ENCOUNTER — Other Ambulatory Visit: Payer: Self-pay

## 2021-07-26 ENCOUNTER — Encounter: Payer: Self-pay | Admitting: Ophthalmology

## 2021-08-02 NOTE — Discharge Instructions (Signed)

## 2021-08-03 ENCOUNTER — Encounter
Admission: RE | Disposition: A | Discharge status: Home / Self Care | Payer: Self-pay | Source: Home / Self Care | Attending: Ophthalmology

## 2021-08-03 ENCOUNTER — Encounter: Payer: Self-pay | Admitting: Ophthalmology

## 2021-08-03 ENCOUNTER — Other Ambulatory Visit: Payer: Self-pay

## 2021-08-03 ENCOUNTER — Ambulatory Visit: Payer: Medicare PPO | Admitting: Anesthesiology

## 2021-08-03 ENCOUNTER — Ambulatory Visit
Admission: RE | Admit: 2021-08-03 | Discharge: 2021-08-03 | Disposition: A | Discharge status: Home / Self Care | Payer: Medicare PPO | Attending: Ophthalmology | Admitting: Ophthalmology

## 2021-08-03 DIAGNOSIS — H5703 Miosis: Secondary | ICD-10-CM | POA: Diagnosis not present

## 2021-08-03 DIAGNOSIS — Z87891 Personal history of nicotine dependence: Secondary | ICD-10-CM | POA: Insufficient documentation

## 2021-08-03 DIAGNOSIS — H2512 Age-related nuclear cataract, left eye: Secondary | ICD-10-CM | POA: Diagnosis not present

## 2021-08-03 DIAGNOSIS — I10 Essential (primary) hypertension: Secondary | ICD-10-CM | POA: Insufficient documentation

## 2021-08-03 HISTORY — PX: CATARACT EXTRACTION W/PHACO: SHX586

## 2021-08-03 SURGERY — PHACOEMULSIFICATION, CATARACT, WITH IOL INSERTION
Anesthesia: Monitor Anesthesia Care | Site: Eye | Laterality: Left

## 2021-08-03 MED ORDER — SIGHTPATH DOSE#1 BSS IO SOLN
INTRAOCULAR | Status: DC | PRN
  Administered 2021-08-03: 80 mL via OPHTHALMIC

## 2021-08-03 MED ORDER — OXYCODONE HCL 5 MG/5ML PO SOLN: 5.0000 mg | Freq: Once | ORAL | Status: DC | PRN

## 2021-08-03 MED ORDER — TETRACAINE HCL 0.5 % OP SOLN
1.0000 [drp] | OPHTHALMIC | Status: DC | PRN
  Administered 2021-08-03 (×3): 1 [drp] via OPHTHALMIC

## 2021-08-03 MED ORDER — LACTATED RINGERS IV SOLN: INTRAVENOUS | Status: DC

## 2021-08-03 MED ORDER — OXYCODONE HCL 5 MG PO TABS: 5.0000 mg | ORAL_TABLET | Freq: Once | ORAL | Status: DC | PRN

## 2021-08-03 MED ORDER — MIDAZOLAM HCL 2 MG/2ML IJ SOLN
INTRAMUSCULAR | Status: DC | PRN
  Administered 2021-08-03 (×2): 1 mg via INTRAVENOUS

## 2021-08-03 MED ORDER — MOXIFLOXACIN HCL 0.5 % OP SOLN
OPHTHALMIC | Status: DC | PRN
  Administered 2021-08-03: 0.2 mL via OPHTHALMIC

## 2021-08-03 MED ORDER — ARMC OPHTHALMIC DILATING DROPS
1.0000 "application " | OPHTHALMIC | Status: DC | PRN
  Administered 2021-08-03 (×3): 1 via OPHTHALMIC

## 2021-08-03 MED ORDER — NEOMYCIN-POLYMYXIN-DEXAMETH 3.5-10000-0.1 OP OINT
TOPICAL_OINTMENT | OPHTHALMIC | Status: DC | PRN
  Administered 2021-08-03: 1 via OPHTHALMIC

## 2021-08-03 MED ORDER — SIGHTPATH DOSE#1 BSS IO SOLN
INTRAOCULAR | Status: DC | PRN
  Administered 2021-08-03: 2 mL

## 2021-08-03 MED ORDER — FENTANYL CITRATE (PF) 100 MCG/2ML IJ SOLN
INTRAMUSCULAR | Status: DC | PRN
Start: 2021-08-03 — End: 2021-08-03
  Administered 2021-08-03: 50 ug via INTRAVENOUS

## 2021-08-03 MED ORDER — BRIMONIDINE TARTRATE-TIMOLOL 0.2-0.5 % OP SOLN
OPHTHALMIC | Status: DC | PRN
Start: 2021-08-03 — End: 2021-08-03
  Administered 2021-08-03: 1 [drp] via OPHTHALMIC

## 2021-08-03 MED ORDER — SIGHTPATH DOSE#1 NA HYALUR & NA CHOND-NA HYALUR IO KIT
PACK | INTRAOCULAR | Status: DC | PRN
  Administered 2021-08-03: 1 via OPHTHALMIC

## 2021-08-03 MED ORDER — SIGHTPATH DOSE#1 BSS IO SOLN
INTRAOCULAR | Status: DC | PRN
Start: 2021-08-03 — End: 2021-08-03
  Administered 2021-08-03: 15 mL via INTRAOCULAR

## 2021-08-03 SURGICAL SUPPLY — 14 items
CANNULA ANT/CHMB 27G (MISCELLANEOUS) IMPLANT
CANNULA ANT/CHMB 27GA (MISCELLANEOUS) IMPLANT
CATARACT SUITE SIGHTPATH (MISCELLANEOUS) ×2 IMPLANT
FEE CATARACT SUITE SIGHTPATH (MISCELLANEOUS) ×1 IMPLANT
GLOVE SRG 8 PF TXTR STRL LF DI (GLOVE) ×1 IMPLANT
GLOVE SURG ENC TEXT LTX SZ7.5 (GLOVE) ×2 IMPLANT
GLOVE SURG UNDER POLY LF SZ8 (GLOVE) ×2
LENS IOL TECNIS EYHANCE 22.0 (Intraocular Lens) ×1 IMPLANT
NDL FILTER BLUNT 18X1 1/2 (NEEDLE) ×1 IMPLANT
NEEDLE FILTER BLUNT 18X 1/2SAF (NEEDLE) ×1
NEEDLE FILTER BLUNT 18X1 1/2 (NEEDLE) ×1 IMPLANT
RING MALYGIN 7.0 (MISCELLANEOUS) ×1 IMPLANT
SYR 3ML LL SCALE MARK (SYRINGE) ×2 IMPLANT
WATER STERILE IRR 250ML POUR (IV SOLUTION) ×2 IMPLANT

## 2021-08-03 NOTE — Transfer of Care (Signed)
Immediate Anesthesia Transfer of Care Note ? ?Patient: Devin Duke ? ?Procedure(s) Performed: CATARACT EXTRACTION PHACO AND INTRAOCULAR LENS PLACEMENT (IOC) LEFT MALYUGIN 6.86 01:11.3  (Left: Eye) ? ?Patient Location: PACU ? ?Anesthesia Type: MAC ? ?Level of Consciousness: awake, alert  and patient cooperative ? ?Airway and Oxygen Therapy: Patient Spontanous Breathing and Patient connected to supplemental oxygen ? ?Post-op Assessment: Post-op Vital signs reviewed, Patient's Cardiovascular Status Stable, Respiratory Function Stable, Patent Airway and No signs of Nausea or vomiting ? ?Post-op Vital Signs: Reviewed and stable ? ?Complications: No notable events documented. ? ?

## 2021-08-03 NOTE — Op Note (Signed)
OPERATIVE NOTE ? ?Devin Duke ?[id number] ?08/03/2021 ? ?PREOPERATIVE DIAGNOSIS:   Nuclear sclerotic cataract left eye with miotic pupil      H25.12 ?  ?POSTOPERATIVE DIAGNOSIS:   Nuclear sclerotic cataract left eye with miotic pupil.   ?  ?PROCEDURE:  Phacoemulsification with posterior chamber intraocular lens implantation of the left eye which required pupil stretching with the Malyugin pupil expansion device ? Ultrasound time: Procedure(s): ?CATARACT EXTRACTION PHACO AND INTRAOCULAR LENS PLACEMENT (IOC) LEFT MALYUGIN 6.86 01:11.3  (Left) ? ?LENS:   ?Implant Name Type Inv. Item Serial No. Manufacturer Lot No. LRB No. Used Action  ?LENS IOL TECNIS EYHANCE 22.0 - [id number] Intraocular Lens LENS IOL TECNIS EYHANCE 22.0 [id number] [employer]  Left 1 Implanted  ? ?SURGEON:  Wyonia Hough, MD ?  ?ANESTHESIA: Topical with tetracaine drops and 2% Xylocaine jelly, augmented with 1% preservative-free intracameral lidocaine. ?  ?COMPLICATIONS:  None. ?  ?DESCRIPTION OF PROCEDURE:  The patient was identified in the holding room and transported to the operating room and placed in the supine position under the operating microscope.  The left eye was identified as the operative eye and it was prepped and draped in the usual sterile ophthalmic fashion. ?  ?A 1 millimeter clear-corneal paracentesis was made at the 1:30 position.  The anterior chamber was filled with Viscoat viscoelastic.  0.5 ml of preservative-free 1% lidocaine was injected into the anterior chamber.  A 2.4 millimeter keratome was used to make a near-clear corneal incision at the 10:30 position.  A Malyugin pupil expander was then placed through the main incision and into the anterior chamber of the eye.  The edge of the iris was secured on the lip of the pupil expander and it was released, thereby expanding the pupil to approximately 7 millimeters for completion of the cataract surgery.  Additional Viscoat was placed in the anterior chamber.  A  cystotome and capsulorrhexis forceps were used to make a curvilinear capsulorrhexis.   Balanced salt solution was used to hydrodissect and hydrodelineate the lens nucleus. ?  ?Phacoemulsification was used in stop and chop fashion to remove the lens, nucleus and epinucleus.  The remaining cortex was aspirated using the irrigation aspiration handpiece.  Additional Provisc was placed into the eye to distend the capsular bag for lens placement.  A lens was then injected into the capsular bag.  The pupil expanding ring was removed using a Kuglen hook and insertion device. The remaining viscoelastic was aspirated from the capsular bag and the anterior chamber.  The anterior chamber was filled with balanced salt solution to inflate to a physiologic pressure.  ? ?Wounds were hydrated with balanced salt solution.  The anterior chamber was inflated to a physiologic pressure with balanced salt solution.  No wound leaks were noted. Vigamox 0.2 ml of a '1mg'$  per ml solution was injected into the anterior chamber for a dose of 0.2 mg of intracameral antibiotic at the completion of the case. ?  Timolol and Brimonidine drops and Maxitrol ointment were applied to the eye.  The patient was taken to the recovery room in stable condition without complications of anesthesia or surgery. ? ?[name] ?08/03/2021, 1:27 PM ? ?

## 2021-08-03 NOTE — Anesthesia Preprocedure Evaluation (Signed)
Anesthesia Evaluation  ?Patient identified by MRN, date of birth, ID band ?Patient awake ? ? ? ?Reviewed: ?NPO status  ? ?History of Anesthesia Complications ?Negative for: history of anesthetic complications ? ?Airway ?Mallampati: II ? ?TM Distance: >3 FB ?Neck ROM: full ? ? ? Dental ?no notable dental hx. ? ?  ?Pulmonary ?neg pulmonary ROS, former smoker,  ?  ?Pulmonary exam normal ? ? ? ? ? ? ? Cardiovascular ?Exercise Tolerance: Good ?hypertension, Normal cardiovascular exam ? ? ?  ?Neuro/Psych ?negative neurological ROS ? negative psych ROS  ? GI/Hepatic ?Neg liver ROS, GERD  Medicated,  ?Endo/Other  ?negative endocrine ROS ? Renal/GU ?negative Renal ROS  ?negative genitourinary ?  ?Musculoskeletal ? ?(+) Arthritis ,  ? Abdominal ?  ?Peds ? Hematology ?prostate cancer 2020; ?   ?Anesthesia Other Findings ?pcp: oct 2022: [facility]; ? ?had ecce on feb 2023; ? ? Reproductive/Obstetrics ? ?  ? ? ? ? ? ? ? ? ? ? ? ? ? ?  ?  ? ? ? ? ? ? ? ? ?Anesthesia Physical ?Anesthesia Plan ? ?ASA: 2 ? ?Anesthesia Plan: MAC  ? ?Post-op Pain Management:   ? ?Induction:  ? ?PONV Risk Score and Plan: 1 and TIVA ? ?Airway Management Planned:  ? ?Additional Equipment:  ? ?Intra-op Plan:  ? ?Post-operative Plan:  ? ?Informed Consent: I have reviewed the patients History and Physical, chart, labs and discussed the procedure including the risks, benefits and alternatives for the proposed anesthesia with the patient or authorized representative who has indicated his/her understanding and acceptance.  ? ? ? ? ? ?Plan Discussed with: CRNA ? ?Anesthesia Plan Comments:   ? ? ? ? ? ? ?Anesthesia Quick Evaluation ? ?

## 2021-08-03 NOTE — H&P (Signed)
?[facility]  ? ?Primary Care Physician:  Adin Hector, MD ?Ophthalmologist: Dr. Leandrew Koyanagi ? ?Pre-Procedure History & Physical: ?HPI:  Devin Duke is a 76 y.o. male here for ophthalmic surgery. ?  ?Past Medical History:  ?Diagnosis Date  ? Colon polyp   ? GERD (gastroesophageal reflux disease)   ? History of kidney stones   ? x1   ? Hypertension   ? Prostate CA [facility])   ? PSA elevation   ? slow [facility]  ? Shoulder arthritis   ? limited ROM/pain  ? Tinnitus   ? ? ?Past Surgical History:  ?Procedure Laterality Date  ?  orif arm Left   ? APPENDECTOMY    ? BACK SURGERY    ? x2 back  ? CATARACT EXTRACTION W/PHACO Right 07/20/2021  ? Procedure: CATARACT EXTRACTION PHACO AND INTRAOCULAR LENS PLACEMENT ([place]) RIGHT;  Surgeon: Leandrew Koyanagi, MD;  Location: [place];  Service: Ophthalmology;  Laterality: Right;  8.06 ?01:15.6  ? COLONOSCOPY WITH PROPOFOL N/A 09/20/2015  ? Procedure: COLONOSCOPY WITH PROPOFOL;  Surgeon: Garlan Fair, MD;  Location: [facility];  Service: Endoscopy;  Laterality: N/A;  ? CYSTOSCOPY W/ STONE MANIPULATION    ? HERNIA REPAIR    ? inguinal hernia repair  ? ? ?Prior to Admission medications   ?Medication Sig Start Date End Date Taking? Authorizing Provider  ?amLODipine-benazepril (LOTREL) 5-20 MG per capsule Take 1 capsule by mouth every morning.    Yes [provider]  ?Multiple Vitamins-Minerals (PRESERVISION AREDS 2 PO) Take by mouth.   Yes [provider]  ? ? ?Allergies as of 07/21/2021 - Review Complete 07/20/2021  ?Allergen Reaction Noted  ? Penicillins  01/25/2014  ? ? ?Family History  ?Family history unknown: Yes  ? ? ?Social History  ? ?Socioeconomic History  ? Marital status: Married  ?  Spouse name: Not on file  ? Number of children: Not on file  ? Years of education: Not on file  ? Highest education level: Not on file  ?Occupational History  ? Not on file  ?Tobacco Use  ?  Smoking status: Former  ?  Types: Cigarettes  ?  Quit date: 09/07/1980  ?  Years since quitting: 40.9  ? Smokeless tobacco: Not on file  ?Vaping Use  ? Vaping Use: Never used  ?Substance and Sexual Activity  ? Alcohol use: Yes  ?  Alcohol/week: 4.0 standard drinks  ?  Types: 4 Standard drinks or equivalent per week  ?  Comment: liquor 2-4 drink/ week  ? Drug use: No  ? Sexual activity: Not on file  ?Other Topics Concern  ? Not on file  ?Social History Narrative  ? Not on file  ? ?Social Determinants of Health  ? ?Financial Resource Strain: Not on file  ?Food Insecurity: Not on file  ?Transportation Needs: Not on file  ?Physical Activity: Not on file  ?Stress: Not on file  ?Social Connections: Not on file  ?Intimate Partner Violence: Not on file  ? ? ?Review of Systems: ?See HPI, otherwise negative ROS ? ?Physical Exam: ?BP 123/70   Pulse (!) 52   Temp 98.1 ?F (36.7 ?C) (Temporal)   Resp 14   Ht '6\' 1"'$  (1.854 m)   Wt 86.6 kg   SpO2 100%   BMI 25.20 kg/m?  ?General:   Alert,  pleasant and cooperative in NAD ?Head:  Normocephalic and atraumatic. ?Lungs:  Clear to auscultation.    ?  Heart:  Regular rate and rhythm.  ? ?Impression/Plan: ?Devin Duke is here for ophthalmic surgery. ? ?Risks, benefits, limitations, and alternatives regarding ophthalmic surgery have been reviewed with the patient.  Questions have been answered.  All parties agreeable. ? ? Leandrew Koyanagi, MD  08/03/2021, 12:08 PM ? ?

## 2021-08-03 NOTE — Anesthesia Procedure Notes (Signed)
Procedure Name: [place] ?Date/Time: 08/03/2021 1:07 PM ?Performed by: Cameron Ali, CRNA ?Pre-anesthesia Checklist: Patient identified, Emergency Drugs available, Suction available, Timeout performed and Patient being monitored ?Patient Re-evaluated:Patient Re-evaluated prior to induction ?Oxygen Delivery Method: Nasal cannula ?Placement Confirmation: positive ETCO2 ? ? ? ? ?

## 2021-08-03 NOTE — Anesthesia Postprocedure Evaluation (Signed)
Anesthesia Post Note ? ?Patient: Devin Duke ? ?Procedure(s) Performed: CATARACT EXTRACTION PHACO AND INTRAOCULAR LENS PLACEMENT (IOC) LEFT MALYUGIN 6.86 01:11.3  (Left: Eye) ? ? ?  ?Patient location during evaluation: PACU ?Anesthesia Type: MAC ?Level of consciousness: awake and alert ?Pain management: pain level controlled ?Vital Signs Assessment: post-procedure vital signs reviewed and stable ?Respiratory status: spontaneous breathing, nonlabored ventilation, respiratory function stable and patient connected to nasal cannula oxygen ?Cardiovascular status: stable and blood pressure returned to baseline ?Postop Assessment: no apparent nausea or vomiting ?Anesthetic complications: no ? ? ?No notable events documented. ? ?Fidel Levy ? ? ? ? ? ?

## 2021-08-04 ENCOUNTER — Encounter: Payer: Self-pay | Admitting: Ophthalmology

## 2021-08-05 ENCOUNTER — Encounter: Payer: Self-pay | Admitting: Ophthalmology

## 2023-07-11 ENCOUNTER — Other Ambulatory Visit: Payer: Self-pay | Admitting: Internal Medicine

## 2023-07-11 DIAGNOSIS — I2089 Other forms of angina pectoris: Secondary | ICD-10-CM

## 2023-07-11 DIAGNOSIS — R9431 Abnormal electrocardiogram [ECG] [EKG]: Secondary | ICD-10-CM

## 2023-07-11 DIAGNOSIS — R0609 Other forms of dyspnea: Secondary | ICD-10-CM

## 2023-07-13 ENCOUNTER — Telehealth (HOSPITAL_COMMUNITY): Payer: Self-pay | Admitting: *Deleted

## 2023-07-13 NOTE — Telephone Encounter (Signed)
Reaching out to patient to offer assistance regarding upcoming cardiac imaging study; pt verbalizes understanding of appt date/time, parking situation and where to check in, pre-test NPO status and medications ordered, and verified current allergies; name and call back number provided for further questions should they arise Johney Frame RN Navigator Cardiac Imaging [facility] [phone] office [phone] cell

## 2023-07-13 NOTE — Telephone Encounter (Signed)
Attempted to call patient regarding upcoming cardiac CT appointment. Left message on voicemail with name and callback number Johney Frame RN Navigator Cardiac Imaging [facility] [phone] Office

## 2023-07-16 ENCOUNTER — Other Ambulatory Visit: Payer: Self-pay | Admitting: Internal Medicine

## 2023-07-16 ENCOUNTER — Ambulatory Visit
Admission: RE | Admit: 2023-07-16 | Discharge: 2023-07-16 | Disposition: A | Discharge status: Home / Self Care | Payer: Medicare PPO | Source: Ambulatory Visit | Attending: Internal Medicine | Admitting: Internal Medicine

## 2023-07-16 ENCOUNTER — Ambulatory Visit
Admission: RE | Admit: 2023-07-16 | Discharge: 2023-07-16 | Disposition: A | Discharge status: Home / Self Care | Payer: Self-pay | Source: Ambulatory Visit | Attending: Cardiology | Admitting: Cardiology

## 2023-07-16 DIAGNOSIS — I2089 Other forms of angina pectoris: Secondary | ICD-10-CM

## 2023-07-16 DIAGNOSIS — R9431 Abnormal electrocardiogram [ECG] [EKG]: Secondary | ICD-10-CM | POA: Diagnosis present

## 2023-07-16 DIAGNOSIS — R0609 Other forms of dyspnea: Secondary | ICD-10-CM

## 2023-07-16 DIAGNOSIS — R931 Abnormal findings on diagnostic imaging of heart and coronary circulation: Secondary | ICD-10-CM

## 2023-07-16 MED ORDER — METOPROLOL TARTRATE 5 MG/5ML IV SOLN: 10.0000 mg | INTRAVENOUS | Status: DC | PRN

## 2023-07-16 MED ORDER — NITROGLYCERIN 0.4 MG SL SUBL
0.8000 mg | SUBLINGUAL_TABLET | Freq: Once | SUBLINGUAL | Status: AC
  Administered 2023-07-16: 0.8 mg via SUBLINGUAL
  Filled 2023-07-16: qty 25

## 2023-07-16 MED ORDER — DILTIAZEM HCL 25 MG/5ML IV SOLN: 10.0000 mg | INTRAVENOUS | Status: DC | PRN

## 2023-07-16 MED ORDER — IOHEXOL 350 MG/ML SOLN
80.0000 mL | Freq: Once | INTRAVENOUS | Status: AC | PRN
  Administered 2023-07-16: 80 mL via INTRAVENOUS

## 2023-07-16 NOTE — Progress Notes (Signed)
 Patient presents for a cardiac CT and tolerated procedure without incident. Patient maintained acceptable vital signs, denies symptoms.  Patient ambulated out of department with a steady gait.

## 2023-08-20 ENCOUNTER — Encounter: Attending: Internal Medicine

## 2023-08-20 ENCOUNTER — Other Ambulatory Visit: Payer: Self-pay

## 2023-08-20 DIAGNOSIS — Z48812 Encounter for surgical aftercare following surgery on the circulatory system: Secondary | ICD-10-CM | POA: Insufficient documentation

## 2023-08-20 DIAGNOSIS — Z951 Presence of aortocoronary bypass graft: Secondary | ICD-10-CM | POA: Insufficient documentation

## 2023-08-20 NOTE — Progress Notes (Signed)
 Virtual Visit completed. Patient informed on EP and RD appointment and 6 Minute walk test. Patient also informed of patient health questionnaires on My Chart. Patient Verbalizes understanding. Visit diagnosis can be found in [facility] 08/01/2023.

## 2023-08-27 ENCOUNTER — Encounter

## 2023-08-27 VITALS — Ht 73.5 in | Wt 186.3 lb

## 2023-08-27 DIAGNOSIS — Z951 Presence of aortocoronary bypass graft: Secondary | ICD-10-CM

## 2023-08-27 DIAGNOSIS — Z48812 Encounter for surgical aftercare following surgery on the circulatory system: Secondary | ICD-10-CM | POA: Diagnosis present

## 2023-08-27 NOTE — Patient Instructions (Signed)
 Patient Instructions  Patient Details  Name: Devin Duke MRN: [id number] Date of Birth: 1946/03/12 Referring Provider:  Lynnea Ferrier, MD  Below are your personal goals for exercise, nutrition, and risk factors. Our goal is to help you stay on track towards obtaining and maintaining these goals. We will be discussing your progress on these goals with you throughout the program.  Initial Exercise Prescription:  Initial Exercise Prescription - 08/27/23 1500       Date of Initial Exercise RX and Referring Provider   Date 08/27/23    Referring Provider Dr. Daniel Nones, MD      Oxygen   Maintain Oxygen Saturation 88% or higher      Treadmill   MPH 2    Grade 0.5    Minutes 15    METs 2.67      Recumbant Bike   Level 2    RPM 50    Watts 14    Minutes 15    METs 2.54      NuStep   Level 2    SPM 80    Minutes 15    METs 2.54      T5 Nustep   Level 1    SPM 80    Minutes 15    METs 2.54      Prescription Details   Frequency (times per week) 3    Duration Progress to 30 minutes of continuous aerobic without signs/symptoms of physical distress      Intensity   THRR 40-80% of Max Heartrate 90-124    Ratings of Perceived Exertion 11-13    Perceived Dyspnea 0-4      Progression   Progression Continue to progress workloads to maintain intensity without signs/symptoms of physical distress.      Resistance Training   Training Prescription Yes    Weight 4 lb    Reps 10-15             Exercise Goals: Frequency: Be able to perform aerobic exercise two to three times per week in program working toward 2-5 days per week of home exercise.  Intensity: Work with a perceived exertion of 11 (fairly light) - 15 (hard) while following your exercise prescription.  We will make changes to your prescription with you as you progress through the program.   Duration: Be able to do 30 to 45 minutes of continuous aerobic exercise in addition to a 5 minute warm-up and a 5  minute cool-down routine.   Nutrition Goals: Your personal nutrition goals will be established when you do your nutrition analysis with the dietician.  The following are general nutrition guidelines to follow: Cholesterol < 200mg /day Sodium < 1500mg /day Fiber: Men over 50 yrs - 30 grams per day  Personal Goals:  Personal Goals and Risk Factors at Admission - 08/20/23 1017       Core Components/Risk Factors/Patient Goals on Admission    Weight Management Yes;Weight Gain    Intervention Weight Management: Develop a combined nutrition and exercise program designed to reach desired caloric intake, while maintaining appropriate intake of nutrient and fiber, sodium and fats, and appropriate energy expenditure required for the weight goal.;Weight Management: Provide education and appropriate resources to help participant work on and attain dietary goals.;Weight Management/Obesity: Establish reasonable short term and long term weight goals.    Expected Outcomes Short Term: Continue to assess and modify interventions until short term weight is achieved;Weight Maintenance: Understanding of the daily nutrition guidelines, which includes 25-35% calories  from fat, 7% or less cal from saturated fats, less than 200mg  cholesterol, less than 1.5gm of sodium, & 5 or more servings of fruits and vegetables daily;Understanding recommendations for meals to include 15-35% energy as protein, 25-35% energy from fat, 35-60% energy from carbohydrates, less than 200mg  of dietary cholesterol, 20-35 gm of total fiber daily;Understanding of distribution of calorie intake throughout the day with the consumption of 4-5 meals/snacks    Hypertension Yes    Intervention Provide education on lifestyle modifcations including regular physical activity/exercise, weight management, moderate sodium restriction and increased consumption of fresh fruit, vegetables, and low fat dairy, alcohol moderation, and smoking cessation.;Monitor  prescription use compliance.    Expected Outcomes Short Term: Continued assessment and intervention until BP is < 140/63mm HG in hypertensive participants. < 130/48mm HG in hypertensive participants with diabetes, heart failure or chronic kidney disease.;Long Term: Maintenance of blood pressure at goal levels.    Lipids Yes    Intervention Provide education and support for participant on nutrition & aerobic/resistive exercise along with prescribed medications to achieve LDL 70mg , HDL >40mg .    Expected Outcomes Short Term: Participant states understanding of desired cholesterol values and is compliant with medications prescribed. Participant is following exercise prescription and nutrition guidelines.;Long Term: Cholesterol controlled with medications as prescribed, with individualized exercise RX and with personalized nutrition plan. Value goals: LDL < 70mg , HDL > 40 mg.            Exercise Goals and Review:  Exercise Goals     Row Name 08/27/23 1409             Exercise Goals   Increase Physical Activity Yes       Intervention Provide advice, education, support and counseling about physical activity/exercise needs.;Develop an individualized exercise prescription for aerobic and resistive training based on initial evaluation findings, risk stratification, comorbidities and participant's personal goals.       Expected Outcomes Short Term: Attend rehab on a regular basis to increase amount of physical activity.;Long Term: Exercising regularly at least 3-5 days a week.;Long Term: Add in home exercise to make exercise part of routine and to increase amount of physical activity.       Increase Strength and Stamina Yes       Intervention Develop an individualized exercise prescription for aerobic and resistive training based on initial evaluation findings, risk stratification, comorbidities and participant's personal goals.;Provide advice, education, support and counseling about physical  activity/exercise needs.       Expected Outcomes Short Term: Increase workloads from initial exercise prescription for resistance, speed, and METs.;Short Term: Perform resistance training exercises routinely during rehab and add in resistance training at home;Long Term: Improve cardiorespiratory fitness, muscular endurance and strength as measured by increased METs and functional capacity ( )       Able to understand and use rate of perceived exertion (RPE) scale Yes       Intervention Provide education and explanation on how to use RPE scale       Expected Outcomes Short Term: Able to use RPE daily in rehab to express subjective intensity level;Long Term:  Able to use RPE to guide intensity level when exercising independently       Able to understand and use Dyspnea scale Yes       Intervention Provide education and explanation on how to use Dyspnea scale       Expected Outcomes Short Term: Able to use Dyspnea scale daily in rehab to express subjective sense of shortness  of breath during exertion;Long Term: Able to use Dyspnea scale to guide intensity level when exercising independently       Knowledge and understanding of Target Heart Rate Range (THRR) Yes       Intervention Provide education and explanation of THRR including how the numbers were predicted and where they are located for reference       Expected Outcomes Short Term: Able to state/look up THRR;Short Term: Able to use daily as guideline for intensity in rehab;Long Term: Able to use THRR to govern intensity when exercising independently       Able to check pulse independently Yes       Intervention Provide education and demonstration on how to check pulse in carotid and radial arteries.;Review the importance of being able to check your own pulse for safety during independent exercise       Expected Outcomes Short Term: Able to explain why pulse checking is important during independent exercise;Long Term: Able to check pulse  independently and accurately       Understanding of Exercise Prescription Yes       Intervention Provide education, explanation, and written materials on patient's individual exercise prescription       Expected Outcomes Short Term: Able to explain program exercise prescription;Long Term: Able to explain home exercise prescription to exercise independently

## 2023-08-27 NOTE — Progress Notes (Signed)
 Cardiac Individual Treatment Plan  Patient Details  Name: Devin Duke MRN: [id number] Date of Birth: Dec 30, 1945 Referring Provider:   Flowsheet Row Cardiac Rehab from 08/27/2023 in [facility] Cardiac and Pulmonary Rehab  Referring Provider Dr. Daniel Nones, MD       Initial Encounter Date:  Flowsheet Row Cardiac Rehab from 08/27/2023 in [facility] Cardiac and Pulmonary Rehab  Date 08/27/23       Visit Diagnosis: S/P CABG x 3  Patient's Home Medications on Admission:  Current Outpatient Medications:    amiodarone (PACERONE) 200 MG tablet, Take by mouth., Disp: , Rfl:    amLODipine (NORVASC) 10 MG tablet, Take by mouth. (Patient not taking: Reported on 08/20/2023), Disp: , Rfl:    amLODipine-benazepril (LOTREL) 5-20 MG capsule, 1 capsule Orally Once a day for 90 (Patient not taking: Reported on 08/20/2023), Disp: , Rfl:    amLODipine-benazepril (LOTREL) 5-20 MG per capsule, Take 1 capsule by mouth every morning.  (Patient not taking: Reported on 08/20/2023), Disp: , Rfl:    aspirin EC 81 MG tablet, Take by mouth., Disp: , Rfl:    furosemide (LASIX) 20 MG tablet, Take by mouth., Disp: , Rfl:    isosorbide mononitrate (IMDUR) 30 MG 24 hr tablet, Take 30 mg by mouth daily., Disp: , Rfl:    metoprolol tartrate (LOPRESSOR) 25 MG tablet, Take by mouth., Disp: , Rfl:    Multiple Vitamins-Minerals (PRESERVISION AREDS 2 PO), Take by mouth., Disp: , Rfl:    nitroGLYCERIN (NITROSTAT) 0.4 MG SL tablet, Place under the tongue., Disp: , Rfl:    potassium chloride SA (KLOR-CON M) 20 MEQ tablet, Take by mouth., Disp: , Rfl:    rosuvastatin (CRESTOR) 40 MG tablet, Take 40 mg by mouth daily., Disp: , Rfl:   Past Medical History: Past Medical History:  Diagnosis Date   Colon polyp    GERD (gastroesophageal reflux disease)    History of kidney stones    x1    Hypertension    Prostate CA (HCC)    PSA elevation    slow increase-[facility]   Shoulder arthritis     limited ROM/pain   Tinnitus     Tobacco Use: Social History   Tobacco Use  Smoking Status Former   Current packs/day: 0.00   Types: Cigarettes   Quit date: 09/07/1980   Years since quitting: 42.9  Smokeless Tobacco Not on file    Labs: Review Flowsheet        No data to display           Exercise Target Goals: Exercise Program Goal: Individual exercise prescription set using results from initial 6 min walk test and THRR while considering  patient's activity barriers and safety.   Exercise Prescription Goal: Initial exercise prescription builds to 30-45 minutes a day of aerobic activity, 2-3 days per week.  Home exercise guidelines will be given to patient during program as part of exercise prescription that the participant will acknowledge.   Education: Aerobic Exercise: - Group verbal and visual presentation on the components of exercise prescription. Introduces F.I.T.T principle from ACSM for exercise prescriptions.  Reviews F.I.T.T. principles of aerobic exercise including progression. Written material given at graduation.   Education: Resistance Exercise: - Group verbal and visual presentation on the components of exercise prescription. Introduces F.I.T.T principle from ACSM for exercise prescriptions  Reviews F.I.T.T. principles of resistance exercise including progression. Written material given at graduation.    Education: Exercise & Equipment Safety: -  Individual verbal instruction and demonstration of equipment use and safety with use of the equipment. Flowsheet Row Cardiac Rehab from 08/20/2023 in [facility] Cardiac and Pulmonary Rehab  Date 08/20/23  Educator [facility]  Instruction Review Code 1- Verbalizes Understanding       Education: Exercise Physiology & General Exercise Guidelines: - Group verbal and written instruction with models to review the exercise physiology of the cardiovascular system and associated critical values. Provides general exercise guidelines  with specific guidelines to those with heart or lung disease.    Education: Flexibility, Balance, Mind/Body Relaxation: - Group verbal and visual presentation with interactive activity on the components of exercise prescription. Introduces F.I.T.T principle from ACSM for exercise prescriptions. Reviews F.I.T.T. principles of flexibility and balance exercise training including progression. Also discusses the mind body connection.  Reviews various relaxation techniques to help reduce and manage stress (i.e. Deep breathing, progressive muscle relaxation, and visualization). Balance handout provided to take home. Written material given at graduation.   Activity Barriers & Risk Stratification:  Activity Barriers & Cardiac Risk Stratification - 08/27/23 1551       Activity Barriers & Cardiac Risk Stratification   Activity Barriers Joint Problems   L shoulder pain, limited ROM   Cardiac Risk Stratification High             6 Minute Walk:  6 Minute Walk     Row Name 08/27/23 1550         6 Minute Walk   Phase Initial     Distance 1145 feet     Walk Time 6 minutes     # of Rest Breaks 0     MPH 2.17     METS 2.54     RPE 12     Perceived Dyspnea  1     VO2 Peak 8.88     Symptoms No     Resting HR 56 bpm     Resting BP 116/68     Resting Oxygen Saturation  97 %     Exercise Oxygen Saturation  during 6 min walk 96 %     Max Ex. HR 102 bpm     Max Ex. BP 138/70     2 Minute Post BP 132/66              Oxygen Initial Assessment:   Oxygen Re-Evaluation:   Oxygen Discharge (Final Oxygen Re-Evaluation):   Initial Exercise Prescription:  Initial Exercise Prescription - 08/27/23 1500       Date of Initial Exercise RX and Referring Provider   Date 08/27/23    Referring Provider Dr. Daniel Nones, MD      Oxygen   Maintain Oxygen Saturation 88% or higher      Treadmill   MPH 2    Grade 0.5    Minutes 15    METs 2.67      Recumbant Bike   Level 2    RPM 50     Watts 14    Minutes 15    METs 2.54      NuStep   Level 2    SPM 80    Minutes 15    METs 2.54      T5 Nustep   Level 1    SPM 80    Minutes 15    METs 2.54      Prescription Details   Frequency (times per week) 3    Duration Progress to 30 minutes of continuous aerobic without signs/symptoms  of physical distress      Intensity   THRR 40-80% of Max Heartrate 90-124    Ratings of Perceived Exertion 11-13    Perceived Dyspnea 0-4      Progression   Progression Continue to progress workloads to maintain intensity without signs/symptoms of physical distress.      Resistance Training   Training Prescription Yes    Weight 4 lb    Reps 10-15             Perform Capillary Blood Glucose checks as needed.  Exercise Prescription Changes:   Exercise Prescription Changes     Row Name 08/27/23 1500             Response to Exercise   Blood Pressure (Admit) 116/68       Blood Pressure (Exercise) 138/70       Blood Pressure (Exit) 132/66       Heart Rate (Admit) 56 bpm       Heart Rate (Exercise) 102 bpm       Heart Rate (Exit) 70 bpm       Oxygen Saturation (Admit) 97 %       Oxygen Saturation (Exercise) 96 %       Rating of Perceived Exertion (Exercise) 12       Perceived Dyspnea (Exercise) 1       Symptoms none       Comments Results                Exercise Comments:   Exercise Goals and Review:   Exercise Goals     Row Name 08/27/23 1409             Exercise Goals   Increase Physical Activity Yes       Intervention Provide advice, education, support and counseling about physical activity/exercise needs.;Develop an individualized exercise prescription for aerobic and resistive training based on initial evaluation findings, risk stratification, comorbidities and participant's personal goals.       Expected Outcomes Short Term: Attend rehab on a regular basis to increase amount of physical activity.;Long Term: Exercising regularly at least  3-5 days a week.;Long Term: Add in home exercise to make exercise part of routine and to increase amount of physical activity.       Increase Strength and Stamina Yes       Intervention Develop an individualized exercise prescription for aerobic and resistive training based on initial evaluation findings, risk stratification, comorbidities and participant's personal goals.;Provide advice, education, support and counseling about physical activity/exercise needs.       Expected Outcomes Short Term: Increase workloads from initial exercise prescription for resistance, speed, and METs.;Short Term: Perform resistance training exercises routinely during rehab and add in resistance training at home;Long Term: Improve cardiorespiratory fitness, muscular endurance and strength as measured by increased METs and functional capacity ( )       Able to understand and use rate of perceived exertion (RPE) scale Yes       Intervention Provide education and explanation on how to use RPE scale       Expected Outcomes Short Term: Able to use RPE daily in rehab to express subjective intensity level;Long Term:  Able to use RPE to guide intensity level when exercising independently       Able to understand and use Dyspnea scale Yes       Intervention Provide education and explanation on how to use Dyspnea scale       Expected Outcomes  Short Term: Able to use Dyspnea scale daily in rehab to express subjective sense of shortness of breath during exertion;Long Term: Able to use Dyspnea scale to guide intensity level when exercising independently       Knowledge and understanding of Target Heart Rate Range (THRR) Yes       Intervention Provide education and explanation of THRR including how the numbers were predicted and where they are located for reference       Expected Outcomes Short Term: Able to state/look up THRR;Short Term: Able to use daily as guideline for intensity in rehab;Long Term: Able to use THRR to govern  intensity when exercising independently       Able to check pulse independently Yes       Intervention Provide education and demonstration on how to check pulse in carotid and radial arteries.;Review the importance of being able to check your own pulse for safety during independent exercise       Expected Outcomes Short Term: Able to explain why pulse checking is important during independent exercise;Long Term: Able to check pulse independently and accurately       Understanding of Exercise Prescription Yes       Intervention Provide education, explanation, and written materials on patient's individual exercise prescription       Expected Outcomes Short Term: Able to explain program exercise prescription;Long Term: Able to explain home exercise prescription to exercise independently                Exercise Goals Re-Evaluation :   Discharge Exercise Prescription (Final Exercise Prescription Changes):  Exercise Prescription Changes - 08/27/23 1500       Response to Exercise   Blood Pressure (Admit) 116/68    Blood Pressure (Exercise) 138/70    Blood Pressure (Exit) 132/66    Heart Rate (Admit) 56 bpm    Heart Rate (Exercise) 102 bpm    Heart Rate (Exit) 70 bpm    Oxygen Saturation (Admit) 97 %    Oxygen Saturation (Exercise) 96 %    Rating of Perceived Exertion (Exercise) 12    Perceived Dyspnea (Exercise) 1    Symptoms none    Comments Results             Nutrition:  Target Goals: Understanding of nutrition guidelines, daily intake of sodium 1500mg , cholesterol 200mg , calories 30% from fat and 7% or less from saturated fats, daily to have 5 or more servings of fruits and vegetables.  Education: All About Nutrition: -Group instruction provided by verbal, written material, interactive activities, discussions, models, and posters to present general guidelines for heart healthy nutrition including fat, fiber, MyPlate, the role of sodium in heart healthy nutrition,  utilization of the nutrition label, and utilization of this knowledge for meal planning. Follow up email sent as well. Written material given at graduation.   Biometrics:  Pre Biometrics - 08/27/23 1551       Pre Biometrics   Height 6' 1.5" (1.867 m)    Weight 186 lb 4.8 oz (84.5 kg)    Waist Circumference 33 inches    Hip Circumference 39.5 inches    Waist to Hip Ratio 0.84 %    BMI (Calculated) 24.24    Single Leg Stand 21.2 seconds              Nutrition Therapy Plan and Nutrition Goals:  Nutrition Therapy & Goals - 08/27/23 1409       Intervention Plan   Intervention Prescribe, educate and  counsel regarding individualized specific dietary modifications aiming towards targeted core components such as weight, hypertension, lipid management, diabetes, heart failure and other comorbidities.    Expected Outcomes Short Term Goal: Understand basic principles of dietary content, such as calories, fat, sodium, cholesterol and nutrients.;Long Term Goal: Adherence to prescribed nutrition plan.             Nutrition Assessments:  MEDIFICTS Score Key: >=70 Need to make dietary changes  40-70 Heart Healthy Diet <= 40 Therapeutic Level Cholesterol Diet   Picture Your Plate Scores: <84 Unhealthy dietary pattern with much room for improvement. 41-50 Dietary pattern unlikely to meet recommendations for good health and room for improvement. 51-60 More healthful dietary pattern, with some room for improvement.  >60 Healthy dietary pattern, although there may be some specific behaviors that could be improved.    Nutrition Goals Re-Evaluation:   Nutrition Goals Discharge (Final Nutrition Goals Re-Evaluation):   Psychosocial: Target Goals: Acknowledge presence or absence of significant depression and/or stress, maximize coping skills, provide positive support system. Participant is able to verbalize types and ability to use techniques and skills needed for reducing stress and  depression.   Education: Stress, Anxiety, and Depression - Group verbal and visual presentation to define topics covered.  Reviews how body is impacted by stress, anxiety, and depression.  Also discusses healthy ways to reduce stress and to treat/manage anxiety and depression.  Written material given at graduation.   Education: Sleep Hygiene -Provides group verbal and written instruction about how sleep can affect your health.  Define sleep hygiene, discuss sleep cycles and impact of sleep habits. Review good sleep hygiene tips.    Initial Review & Psychosocial Screening:  Initial Psych Review & Screening - 08/20/23 1018       Initial Review   Current issues with None Identified      Family Dynamics   Good Support System? Yes    Comments Kaison can look to his wife and son for support. He reports no mental instabilities and does not take anything for his mood.      Barriers   Psychosocial barriers to participate in program The patient should benefit from training in stress management and relaxation.;There are no identifiable barriers or psychosocial needs.      Screening Interventions   Interventions Encouraged to exercise;To provide support and resources with identified psychosocial needs;Provide feedback about the scores to participant;Program counselor consult    Expected Outcomes Short Term goal: Utilizing psychosocial counselor, staff and physician to assist with identification of specific Stressors or current issues interfering with healing process. Setting desired goal for each stressor or current issue identified.;Long Term Goal: Stressors or current issues are controlled or eliminated.;Short Term goal: Identification and review with participant of any Quality of Life or Depression concerns found by scoring the questionnaire.;Long Term goal: The participant improves quality of Life and PHQ9 Scores as seen by post scores and/or verbalization of changes             Quality of  Life Scores:   Scores of 19 and below usually indicate a poorer quality of life in these areas.  A difference of  2-3 points is a clinically meaningful difference.  A difference of 2-3 points in the total score of the Quality of Life Index has been associated with significant improvement in overall quality of life, self-image, physical symptoms, and general health in studies assessing change in quality of life.  PHQ-9: Review Flowsheet       08/27/2023  Depression screen PHQ 2/9  Decreased Interest 1  Down, Depressed, Hopeless 0  PHQ - 2 Score 1  Altered sleeping 0  Tired, decreased energy 1  Change in appetite 0  Feeling bad or failure about yourself  0  Trouble concentrating 0  Moving slowly or fidgety/restless 0  Suicidal thoughts 0  PHQ-9 Score 2  Difficult doing work/chores Not difficult at all   Interpretation of Total Score  Total Score Depression Severity:  1-4 = Minimal depression, 5-9 = Mild depression, 10-14 = Moderate depression, 15-19 = Moderately severe depression, 20-27 = Severe depression   Psychosocial Evaluation and Intervention:  Psychosocial Evaluation - 08/20/23 1019       Psychosocial Evaluation & Interventions   Interventions Encouraged to exercise with the program and follow exercise prescription;Relaxation education;Stress management education    Comments Travez can look to his wife and son for support. He reports no mental instabilities and does not take anything for his mood.    Expected Outcomes Short: Start Heart[facility] to help with mood. Long: Maintain a healthy mental state    Continue Psychosocial Services  Follow up required by staff             Psychosocial Re-Evaluation:   Psychosocial Discharge (Final Psychosocial Re-Evaluation):   Vocational Rehabilitation: Provide vocational rehab assistance to qualifying candidates.   Vocational Rehab Evaluation & Intervention:   Education: Education Goals: Education classes will be provided  on a variety of topics geared toward better understanding of heart health and risk factor modification. Participant will state understanding/return demonstration of topics presented as noted by education test scores.  Learning Barriers/Preferences:  Learning Barriers/Preferences - 08/20/23 1018       Learning Barriers/Preferences   Learning Barriers None    Learning Preferences None             General Cardiac Education Topics:  AED/CPR: - Group verbal and written instruction with the use of models to demonstrate the basic use of the AED with the basic ABC's of resuscitation.   Anatomy and Cardiac Procedures: - Group verbal and visual presentation and models provide information about basic cardiac anatomy and function. Reviews the testing methods done to diagnose heart disease and the outcomes of the test results. Describes the treatment choices: Medical Management, Angioplasty, or Coronary Bypass Surgery for treating various heart conditions including Myocardial Infarction, Angina, Valve Disease, and Cardiac Arrhythmias.  Written material given at graduation.   Medication Safety: - Group verbal and visual instruction to review commonly prescribed medications for heart and lung disease. Reviews the medication, class of the drug, and side effects. Includes the steps to properly store meds and maintain the prescription regimen.  Written material given at graduation.   Intimacy: - Group verbal instruction through game format to discuss how heart and lung disease can affect sexual intimacy. Written material given at graduation..   Know Your Numbers and Heart Failure: - Group verbal and visual instruction to discuss disease risk factors for cardiac and pulmonary disease and treatment options.  Reviews associated critical values for Overweight/Obesity, Hypertension, Cholesterol, and Diabetes.  Discusses basics of heart failure: signs/symptoms and treatments.  Introduces Heart Failure  Zone chart for action plan for heart failure.  Written material given at graduation.   Infection Prevention: - Provides verbal and written material to individual with discussion of infection control including proper hand washing and proper equipment cleaning during exercise session. Flowsheet Row Cardiac Rehab from 08/20/2023 in [facility] Cardiac and Pulmonary Rehab  Date 08/20/23  Educator [facility]  Instruction Review Code 1- Verbalizes Understanding       Falls Prevention: - Provides verbal and written material to individual with discussion of falls prevention and safety. Flowsheet Row Cardiac Rehab from 08/20/2023 in [facility] Cardiac and Pulmonary Rehab  Date 08/20/23  Educator [facility]  Instruction Review Code 1- Verbalizes Understanding       Other: -Provides group and verbal instruction on various topics (see comments)   Knowledge Questionnaire Score:   Core Components/Risk Factors/Patient Goals at Admission:  Personal Goals and Risk Factors at Admission - 08/20/23 1017       Core Components/Risk Factors/Patient Goals on Admission    Weight Management Yes;Weight Gain    Intervention Weight Management: Develop a combined nutrition and exercise program designed to reach desired caloric intake, while maintaining appropriate intake of nutrient and fiber, sodium and fats, and appropriate energy expenditure required for the weight goal.;Weight Management: Provide education and appropriate resources to help participant work on and attain dietary goals.;Weight Management/Obesity: Establish reasonable short term and long term weight goals.    Expected Outcomes Short Term: Continue to assess and modify interventions until short term weight is achieved;Weight Maintenance: Understanding of the daily nutrition guidelines, which includes 25-35% calories from fat, 7% or less cal from saturated fats, less than 200mg  cholesterol, less than 1.5gm of sodium, & 5 or more servings of fruits and vegetables  daily;Understanding recommendations for meals to include 15-35% energy as protein, 25-35% energy from fat, 35-60% energy from carbohydrates, less than 200mg  of dietary cholesterol, 20-35 gm of total fiber daily;Understanding of distribution of calorie intake throughout the day with the consumption of 4-5 meals/snacks    Hypertension Yes    Intervention Provide education on lifestyle modifcations including regular physical activity/exercise, weight management, moderate sodium restriction and increased consumption of fresh fruit, vegetables, and low fat dairy, alcohol moderation, and smoking cessation.;Monitor prescription use compliance.    Expected Outcomes Short Term: Continued assessment and intervention until BP is < 140/70mm HG in hypertensive participants. < 130/44mm HG in hypertensive participants with diabetes, heart failure or chronic kidney disease.;Long Term: Maintenance of blood pressure at goal levels.    Lipids Yes    Intervention Provide education and support for participant on nutrition & aerobic/resistive exercise along with prescribed medications to achieve LDL 70mg , HDL >40mg .    Expected Outcomes Short Term: Participant states understanding of desired cholesterol values and is compliant with medications prescribed. Participant is following exercise prescription and nutrition guidelines.;Long Term: Cholesterol controlled with medications as prescribed, with individualized exercise RX and with personalized nutrition plan. Value goals: LDL < 70mg , HDL > 40 mg.             Education:Diabetes - Individual verbal and written instruction to review signs/symptoms of diabetes, desired ranges of glucose level fasting, after meals and with exercise. Acknowledge that pre and post exercise glucose checks will be done for 3 sessions at entry of program.   Core Components/Risk Factors/Patient Goals Review:    Core Components/Risk Factors/Patient Goals at Discharge (Final Review):    ITP  Comments:  ITP Comments     Row Name 08/20/23 1017 08/27/23 1407         ITP Comments Virtual Visit completed. Patient informed on EP and RD appointment and 6 Minute walk test. Patient also informed of patient health questionnaires on My Chart. Patient Verbalizes understanding. Visit diagnosis can be found in [facility] 08/01/[facility]. Completed and gym orientation. Initial ITP created and sent for review to Dr.  Bethann Punches, Medical Director.               Comments: Initial ITP

## 2023-08-29 ENCOUNTER — Encounter: Attending: Internal Medicine | Admitting: *Deleted

## 2023-08-29 DIAGNOSIS — Z951 Presence of aortocoronary bypass graft: Secondary | ICD-10-CM | POA: Insufficient documentation

## 2023-08-29 NOTE — Progress Notes (Signed)
 Daily Session Note  Patient Details  Name: Devin Duke MRN: [id number] Date of Birth: November 18, 1945 Referring Provider:   Flowsheet Row Cardiac Rehab from 08/27/2023 in [facility] Cardiac and Pulmonary Rehab  Referring Provider Dr. Daniel Nones, MD       Encounter Date: 08/29/2023  Check In:  Session Check In - 08/29/23 0932       Check-In   Supervising physician immediately available to respond to emergencies See telemetry face sheet for immediately available ER MD    Location [facility]-Cardiac & Pulmonary Rehab    Staff Present Cora Collum, RN, BSN, CCRP;Joseph Hood RCP,RRT,BSRT;Maxon Hoyt BS, Exercise Physiologist;Noah Tickle, BS, Exercise Physiologist;Jason Wallace Cullens RDN,LDN    Virtual Visit No    Medication changes reported     No    Fall or balance concerns reported    No    Warm-up and Cool-down Performed on first and last piece of equipment    Resistance Training Performed Yes    VAD Patient? No    PAD/SET Patient? No      Pain Assessment   Currently in Pain? No/denies                Social History   Tobacco Use  Smoking Status Former   Current packs/day: 0.00   Types: Cigarettes   Quit date: 09/07/1980   Years since quitting: 43.0  Smokeless Tobacco Not on file    Goals Met:  Exercise tolerated well Personal goals reviewed No report of concerns or symptoms today  Goals Unmet:  Not Applicable  Comments: Pt able to follow exercise prescription today without complaint.  Will continue to monitor for progression. First full day of exercise!  Patient was oriented to gym and equipment including functions, settings, policies, and procedures.  Patient's individual exercise prescription and treatment plan were reviewed.  All starting workloads were established based on the results of the 6 minute walk test done at initial orientation visit.  The plan for exercise progression was also introduced and progression will be customized based on patient's performance and  goals.    Dr. Bethann Punches is Medical Director for [facility] Cardiac Rehabilitation.  Dr. Vida Rigger is Medical Director for [facility] Pulmonary Rehabilitation.

## 2023-08-31 ENCOUNTER — Encounter: Admitting: *Deleted

## 2023-08-31 DIAGNOSIS — Z951 Presence of aortocoronary bypass graft: Secondary | ICD-10-CM | POA: Diagnosis not present

## 2023-08-31 NOTE — Progress Notes (Signed)
 Daily Session Note  Patient Details  Name: AVYAN LIVESAY MRN: [id number] Date of Birth: [birth year] Referring Provider:   Flowsheet Row Cardiac Rehab from 08/27/2023 in [facility] Cardiac and Pulmonary Rehab  Referring Provider Dr. Daniel Nones, MD       Encounter Date: 08/31/2023  Check In:  Session Check In - 08/31/23 0927       Check-In   Supervising physician immediately available to respond to emergencies See telemetry face sheet for immediately available ER MD    Location [facility]-Cardiac & Pulmonary Rehab    Staff Present Rory Percy, MS, Exercise Physiologist;Maxon Conetta BS, Exercise Physiologist;Noah Tickle, BS, Exercise Physiologist;Katerin Negrete, RN, BSN, CCRP    Virtual Visit No    Medication changes reported     No    Fall or balance concerns reported    No    Warm-up and Cool-down Performed on first and last piece of equipment    Resistance Training Performed Yes    VAD Patient? No    PAD/SET Patient? No      Pain Assessment   Currently in Pain? No/denies                Social History   Tobacco Use  Smoking Status Former   Current packs/day: 0.00   Types: Cigarettes   Quit date: 09/07/1980   Years since quitting: 43.0  Smokeless Tobacco Not on file    Goals Met:  Independence with exercise equipment Exercise tolerated well No report of concerns or symptoms today  Goals Unmet:  Not Applicable  Comments: Pt able to follow exercise prescription today without complaint.  Will continue to monitor for progression.    Dr. Bethann Punches is Medical Director for [facility] Cardiac Rehabilitation.  Dr. Vida Rigger is Medical Director for [facility] Pulmonary Rehabilitation.

## 2023-09-03 ENCOUNTER — Encounter: Admitting: *Deleted

## 2023-09-03 DIAGNOSIS — Z951 Presence of aortocoronary bypass graft: Secondary | ICD-10-CM

## 2023-09-03 NOTE — Progress Notes (Signed)
 Daily Session Note  Patient Details  Name: Devin Duke MRN: [id number] Date of Birth: 06-May-1946 Referring Provider:   Flowsheet Row Cardiac Rehab from 08/27/2023 in [facility] Cardiac and Pulmonary Rehab  Referring Provider Dr. Daniel Nones, MD       Encounter Date: 09/03/2023  Check In:  Session Check In - 09/03/23 0942       Check-In   Supervising physician immediately available to respond to emergencies See telemetry face sheet for immediately available ER MD    Location [facility]-Cardiac & Pulmonary Rehab    Staff Present Cora Collum, RN, BSN, CCRP;Joseph Hood RCP,RRT,BSRT;Kelly [place] BS, ACSM CEP, Exercise Physiologist;Maxon Conetta BS, Exercise Physiologist    Virtual Visit No    Medication changes reported     No    Fall or balance concerns reported    No    Warm-up and Cool-down Performed on first and last piece of equipment    Resistance Training Performed Yes    VAD Patient? No    PAD/SET Patient? No      Pain Assessment   Currently in Pain? No/denies                Social History   Tobacco Use  Smoking Status Former   Current packs/day: 0.00   Types: Cigarettes   Quit date: 09/07/1980   Years since quitting: 43.0  Smokeless Tobacco Not on file    Goals Met:  Independence with exercise equipment Exercise tolerated well No report of concerns or symptoms today  Goals Unmet:  Not Applicable  Comments: Pt able to follow exercise prescription today without complaint.  Will continue to monitor for progression.    Dr. Bethann Punches is Medical Director for [facility] Cardiac Rehabilitation.  Dr. Vida Rigger is Medical Director for [facility] Pulmonary Rehabilitation.

## 2023-09-03 NOTE — Progress Notes (Signed)
[id number]  

## 2023-09-05 ENCOUNTER — Encounter: Payer: Self-pay | Admitting: *Deleted

## 2023-09-05 ENCOUNTER — Encounter

## 2023-09-05 DIAGNOSIS — Z951 Presence of aortocoronary bypass graft: Secondary | ICD-10-CM | POA: Diagnosis not present

## 2023-09-05 NOTE — Progress Notes (Signed)
 Daily Session Note  Patient Details  Name: Devin Duke MRN: [id number] Date of Birth: 1945-09-15 Referring Provider:   Flowsheet Row Cardiac Rehab from 08/27/2023 in [facility] Cardiac and Pulmonary Rehab  Referring Provider Dr. Daniel Nones, MD       Encounter Date: 09/05/2023  Check In:  Session Check In - 09/05/23 1008       Check-In   Supervising physician immediately available to respond to emergencies See telemetry face sheet for immediately available ER MD    Location [facility]-Cardiac & Pulmonary Rehab    Staff Present Kelton Pillar RN,BSN,MPA;Maxon Conetta BS, Exercise Physiologist;Joseph Reino Kent RCP,RRT,BSRT;Meredith Jewel Baize RN,BSN    Virtual Visit No    Medication changes reported     No    Fall or balance concerns reported    No    Warm-up and Cool-down Performed on first and last piece of equipment    Resistance Training Performed Yes    VAD Patient? No    PAD/SET Patient? No      Pain Assessment   Currently in Pain? No/denies                Social History   Tobacco Use  Smoking Status Former   Current packs/day: 0.00   Types: Cigarettes   Quit date: 09/07/1980   Years since quitting: 43.0  Smokeless Tobacco Not on file    Goals Met:  Independence with exercise equipment Exercise tolerated well No report of concerns or symptoms today Strength training completed today  Goals Unmet:  Not Applicable  Comments: Pt able to follow exercise prescription today without complaint.  Will continue to monitor for progression.    Dr. Bethann Punches is Medical Director for [facility] Cardiac Rehabilitation.  Dr. Vida Rigger is Medical Director for [facility] Pulmonary Rehabilitation.

## 2023-09-05 NOTE — Progress Notes (Signed)
 Cardiac Individual Treatment Plan  Patient Details  Name: Devin Duke MRN: [id number] Date of Birth: 1946/05/01 Referring Provider:   Flowsheet Row Cardiac Rehab from 08/27/2023 in [facility] Cardiac and Pulmonary Rehab  Referring Provider Dr. Daniel Nones, MD       Initial Encounter Date:  Flowsheet Row Cardiac Rehab from 08/27/2023 in [facility] Cardiac and Pulmonary Rehab  Date 08/27/23       Visit Diagnosis: S/P CABG x 3  Patient's Home Medications on Admission:  Current Outpatient Medications:    amiodarone (PACERONE) 200 MG tablet, Take by mouth., Disp: , Rfl:    amLODipine (NORVASC) 10 MG tablet, Take by mouth. (Patient not taking: Reported on 08/20/2023), Disp: , Rfl:    amLODipine-benazepril (LOTREL) 5-20 MG capsule, 1 capsule Orally Once a day for 90 (Patient not taking: Reported on 08/20/2023), Disp: , Rfl:    amLODipine-benazepril (LOTREL) 5-20 MG per capsule, Take 1 capsule by mouth every morning.  (Patient not taking: Reported on 08/20/2023), Disp: , Rfl:    aspirin EC 81 MG tablet, Take by mouth., Disp: , Rfl:    furosemide (LASIX) 20 MG tablet, Take by mouth., Disp: , Rfl:    isosorbide mononitrate (IMDUR) 30 MG 24 hr tablet, Take 30 mg by mouth daily., Disp: , Rfl:    metoprolol tartrate (LOPRESSOR) 25 MG tablet, Take by mouth., Disp: , Rfl:    Multiple Vitamins-Minerals (PRESERVISION AREDS 2 PO), Take by mouth., Disp: , Rfl:    nitroGLYCERIN (NITROSTAT) 0.4 MG SL tablet, Place under the tongue., Disp: , Rfl:    potassium chloride SA (KLOR-CON M) 20 MEQ tablet, Take by mouth., Disp: , Rfl:    rosuvastatin (CRESTOR) 40 MG tablet, Take 40 mg by mouth daily., Disp: , Rfl:   Past Medical History: Past Medical History:  Diagnosis Date   Colon polyp    GERD (gastroesophageal reflux disease)    History of kidney stones    x1    Hypertension    Prostate CA (HCC)    PSA elevation    slow increase-[facility]   Shoulder arthritis     limited ROM/pain   Tinnitus     Tobacco Use: Social History   Tobacco Use  Smoking Status Former   Current packs/day: 0.00   Types: Cigarettes   Quit date: 09/07/1980   Years since quitting: 43.0  Smokeless Tobacco Not on file    Labs: Review Flowsheet        No data to display           Exercise Target Goals: Exercise Program Goal: Individual exercise prescription set using results from initial 6 min walk test and THRR while considering  patient's activity barriers and safety.   Exercise Prescription Goal: Initial exercise prescription builds to 30-45 minutes a day of aerobic activity, 2-3 days per week.  Home exercise guidelines will be given to patient during program as part of exercise prescription that the participant will acknowledge.   Education: Aerobic Exercise: - Group verbal and visual presentation on the components of exercise prescription. Introduces F.I.T.T principle from ACSM for exercise prescriptions.  Reviews F.I.T.T. principles of aerobic exercise including progression. Written material given at graduation. Flowsheet Row Cardiac Rehab from 09/05/2023 in [facility] Cardiac and Pulmonary Rehab  Education need identified 08/29/23       Education: Resistance Exercise: - Group verbal and visual presentation on the components of exercise prescription. Introduces F.I.T.T principle from ACSM for exercise prescriptions  Reviews  F.I.T.T. principles of resistance exercise including progression. Written material given at graduation.    Education: Exercise & Equipment Safety: - Individual verbal instruction and demonstration of equipment use and safety with use of the equipment. Flowsheet Row Cardiac Rehab from 09/05/2023 in [facility] Cardiac and Pulmonary Rehab  Date 08/20/23  Educator [facility]  Instruction Review Code 1- Verbalizes Understanding       Education: Exercise Physiology & General Exercise Guidelines: - Group verbal and written instruction with models to review  the exercise physiology of the cardiovascular system and associated critical values. Provides general exercise guidelines with specific guidelines to those with heart or lung disease.    Education: Flexibility, Balance, Mind/Body Relaxation: - Group verbal and visual presentation with interactive activity on the components of exercise prescription. Introduces F.I.T.T principle from ACSM for exercise prescriptions. Reviews F.I.T.T. principles of flexibility and balance exercise training including progression. Also discusses the mind body connection.  Reviews various relaxation techniques to help reduce and manage stress (i.e. Deep breathing, progressive muscle relaxation, and visualization). Balance handout provided to take home. Written material given at graduation.   Activity Barriers & Risk Stratification:  Activity Barriers & Cardiac Risk Stratification - 08/27/23 1551       Activity Barriers & Cardiac Risk Stratification   Activity Barriers Joint Problems   L shoulder pain, limited ROM   Cardiac Risk Stratification High             6 Minute Walk:  6 Minute Walk     Row Name 08/27/23 1550         6 Minute Walk   Phase Initial     Distance 1145 feet     Walk Time 6 minutes     # of Rest Breaks 0     MPH 2.17     METS 2.54     RPE 12     Perceived Dyspnea  1     VO2 Peak 8.88     Symptoms No     Resting HR 56 bpm     Resting BP 116/68     Resting Oxygen Saturation  97 %     Exercise Oxygen Saturation  during 6 min walk 96 %     Max Ex. HR 102 bpm     Max Ex. BP 138/70     2 Minute Post BP 132/66              Oxygen Initial Assessment:   Oxygen Re-Evaluation:   Oxygen Discharge (Final Oxygen Re-Evaluation):   Initial Exercise Prescription:  Initial Exercise Prescription - 08/27/23 1500       Date of Initial Exercise RX and Referring Provider   Date 08/27/23    Referring Provider Dr. Daniel Nones, MD      Oxygen   Maintain Oxygen Saturation 88% or  higher      Treadmill   MPH 2    Grade 0.5    Minutes 15    METs 2.67      Recumbant Bike   Level 2    RPM 50    Watts 14    Minutes 15    METs 2.54      NuStep   Level 2    SPM 80    Minutes 15    METs 2.54      T5 Nustep   Level 1    SPM 80    Minutes 15    METs 2.54      Prescription  Details   Frequency (times per week) 3    Duration Progress to 30 minutes of continuous aerobic without signs/symptoms of physical distress      Intensity   THRR 40-80% of Max Heartrate 90-124    Ratings of Perceived Exertion 11-13    Perceived Dyspnea 0-4      Progression   Progression Continue to progress workloads to maintain intensity without signs/symptoms of physical distress.      Resistance Training   Training Prescription Yes    Weight 4 lb    Reps 10-15             Perform Capillary Blood Glucose checks as needed.  Exercise Prescription Changes:   Exercise Prescription Changes     Row Name 08/27/23 1500 09/04/23 1300           Response to Exercise   Blood Pressure (Admit) 116/68 126/68      Blood Pressure (Exercise) 138/70 128/64      Blood Pressure (Exit) 132/66 110/60      Heart Rate (Admit) 56 bpm 75 bpm      Heart Rate (Exercise) 102 bpm 109 bpm      Heart Rate (Exit) 70 bpm 75 bpm      Oxygen Saturation (Admit) 97 % --      Oxygen Saturation (Exercise) 96 % --      Rating of Perceived Exertion (Exercise) 12 13      Perceived Dyspnea (Exercise) 1 --      Symptoms none none      Comments Results First two days of exercise      Duration -- Continue with 30 min of aerobic exercise without signs/symptoms of physical distress.      Intensity -- THRR unchanged        Progression   Progression -- Continue to progress workloads to maintain intensity without signs/symptoms of physical distress.      Average METs -- 2.9        Resistance Training   Training Prescription -- Yes      Weight -- 4 lb      Reps -- 10-15        Interval Training    Interval Training -- No        Treadmill   MPH -- 2      Grade -- 0.5      Minutes -- 15      METs -- 2.67        Recumbant Bike   Level -- 2      Watts -- 14      Minutes -- 15      METs -- 2.52        NuStep   Level -- 5      Minutes -- 15      METs -- 4.4        T5 Nustep   Level -- 1      Minutes -- 15      METs -- 2        Oxygen   Maintain Oxygen Saturation -- 88% or higher               Exercise Comments:   Exercise Comments     Row Name 08/29/23 0935           Exercise Comments First full day of exercise!  Patient was oriented to gym and equipment including functions, settings, policies, and procedures.  Patient's individual exercise prescription and  treatment plan were reviewed.  All starting workloads were established based on the results of the 6 minute walk test done at initial orientation visit.  The plan for exercise progression was also introduced and progression will be customized based on patient's performance and goals.                Exercise Goals and Review:   Exercise Goals     Row Name 08/27/23 1409             Exercise Goals   Increase Physical Activity Yes       Intervention Provide advice, education, support and counseling about physical activity/exercise needs.;Develop an individualized exercise prescription for aerobic and resistive training based on initial evaluation findings, risk stratification, comorbidities and participant's personal goals.       Expected Outcomes Short Term: Attend rehab on a regular basis to increase amount of physical activity.;Long Term: Exercising regularly at least 3-5 days a week.;Long Term: Add in home exercise to make exercise part of routine and to increase amount of physical activity.       Increase Strength and Stamina Yes       Intervention Develop an individualized exercise prescription for aerobic and resistive training based on initial evaluation findings, risk stratification,  comorbidities and participant's personal goals.;Provide advice, education, support and counseling about physical activity/exercise needs.       Expected Outcomes Short Term: Increase workloads from initial exercise prescription for resistance, speed, and METs.;Short Term: Perform resistance training exercises routinely during rehab and add in resistance training at home;Long Term: Improve cardiorespiratory fitness, muscular endurance and strength as measured by increased METs and functional capacity ( )       Able to understand and use rate of perceived exertion (RPE) scale Yes       Intervention Provide education and explanation on how to use RPE scale       Expected Outcomes Short Term: Able to use RPE daily in rehab to express subjective intensity level;Long Term:  Able to use RPE to guide intensity level when exercising independently       Able to understand and use Dyspnea scale Yes       Intervention Provide education and explanation on how to use Dyspnea scale       Expected Outcomes Short Term: Able to use Dyspnea scale daily in rehab to express subjective sense of shortness of breath during exertion;Long Term: Able to use Dyspnea scale to guide intensity level when exercising independently       Knowledge and understanding of Target Heart Rate Range (THRR) Yes       Intervention Provide education and explanation of THRR including how the numbers were predicted and where they are located for reference       Expected Outcomes Short Term: Able to state/look up THRR;Short Term: Able to use daily as guideline for intensity in rehab;Long Term: Able to use THRR to govern intensity when exercising independently       Able to check pulse independently Yes       Intervention Provide education and demonstration on how to check pulse in carotid and radial arteries.;Review the importance of being able to check your own pulse for safety during independent exercise       Expected Outcomes Short Term: Able  to explain why pulse checking is important during independent exercise;Long Term: Able to check pulse independently and accurately       Understanding of Exercise Prescription Yes  Intervention Provide education, explanation, and written materials on patient's individual exercise prescription       Expected Outcomes Short Term: Able to explain program exercise prescription;Long Term: Able to explain home exercise prescription to exercise independently                Exercise Goals Re-Evaluation :  Exercise Goals Re-Evaluation     Row Name 08/29/23 0937 09/04/23 1348           Exercise Goal Re-Evaluation   Exercise Goals Review Able to understand and use rate of perceived exertion (RPE) scale;Knowledge and understanding of Target Heart Rate Range (THRR);Able to understand and use Dyspnea scale;Understanding of Exercise Prescription Increase Physical Activity;Increase Strength and Stamina;Understanding of Exercise Prescription      Comments Reviewed RPE and dyspnea scale, THR and program prescription with pt today.  Pt voiced understanding and was given a copy of goals to take home. Brett Canales is off to a good start in the program. He did well on the treadmill at a speed of 2 mph with an incline of 0.5%. He also improved to level 5 on the T4 nustep and did well working at level 1 on the T5 nustep and level 2 on the recumbent bike. We will continue to monitor his progress in the program.      Expected Outcomes Short: Use RPE daily to regulate intensity. Long: Follow program prescription in THR. Short: Continue to follow current exercise prescription. Long: Continue exercise to improve strength and stamina.               Discharge Exercise Prescription (Final Exercise Prescription Changes):  Exercise Prescription Changes - 09/04/23 1300       Response to Exercise   Blood Pressure (Admit) 126/68    Blood Pressure (Exercise) 128/64    Blood Pressure (Exit) 110/60    Heart Rate (Admit)  75 bpm    Heart Rate (Exercise) 109 bpm    Heart Rate (Exit) 75 bpm    Rating of Perceived Exertion (Exercise) 13    Symptoms none    Comments First two days of exercise    Duration Continue with 30 min of aerobic exercise without signs/symptoms of physical distress.    Intensity THRR unchanged      Progression   Progression Continue to progress workloads to maintain intensity without signs/symptoms of physical distress.    Average METs 2.9      Resistance Training   Training Prescription Yes    Weight 4 lb    Reps 10-15      Interval Training   Interval Training No      Treadmill   MPH 2    Grade 0.5    Minutes 15    METs 2.67      Recumbant Bike   Level 2    Watts 14    Minutes 15    METs 2.52      NuStep   Level 5    Minutes 15    METs 4.4      T5 Nustep   Level 1    Minutes 15    METs 2      Oxygen   Maintain Oxygen Saturation 88% or higher             Nutrition:  Target Goals: Understanding of nutrition guidelines, daily intake of sodium 1500mg , cholesterol 200mg , calories 30% from fat and 7% or less from saturated fats, daily to have 5 or more servings of fruits  and vegetables.  Education: All About Nutrition: -Group instruction provided by verbal, written material, interactive activities, discussions, models, and posters to present general guidelines for heart healthy nutrition including fat, fiber, MyPlate, the role of sodium in heart healthy nutrition, utilization of the nutrition label, and utilization of this knowledge for meal planning. Follow up email sent as well. Written material given at graduation. Flowsheet Row Cardiac Rehab from 09/05/2023 in [facility] Cardiac and Pulmonary Rehab  Date 08/29/23  Educator JG part 2  Instruction Review Code 1- Verbalizes Understanding       Biometrics:  Pre Biometrics - 08/27/23 1551       Pre Biometrics   Height 6' 1.5" (1.867 m)    Weight 186 lb 4.8 oz (84.5 kg)    Waist Circumference 33 inches     Hip Circumference 39.5 inches    Waist to Hip Ratio 0.84 %    BMI (Calculated) 24.24    Single Leg Stand 21.2 seconds              Nutrition Therapy Plan and Nutrition Goals:  Nutrition Therapy & Goals - 08/27/23 1409       Intervention Plan   Intervention Prescribe, educate and counsel regarding individualized specific dietary modifications aiming towards targeted core components such as weight, hypertension, lipid management, diabetes, heart failure and other comorbidities.    Expected Outcomes Short Term Goal: Understand basic principles of dietary content, such as calories, fat, sodium, cholesterol and nutrients.;Long Term Goal: Adherence to prescribed nutrition plan.             Nutrition Assessments:  MEDIFICTS Score Key: >=70 Need to make dietary changes  40-70 Heart Healthy Diet <= 40 Therapeutic Level Cholesterol Diet  Flowsheet Row Cardiac Rehab from 08/29/2023 in [facility] Cardiac and Pulmonary Rehab  Picture Your Plate Total Score on Admission 72      Picture Your Plate Scores: <98 Unhealthy dietary pattern with much room for improvement. 41-50 Dietary pattern unlikely to meet recommendations for good health and room for improvement. 51-60 More healthful dietary pattern, with some room for improvement.  >60 Healthy dietary pattern, although there may be some specific behaviors that could be improved.    Nutrition Goals Re-Evaluation:   Nutrition Goals Discharge (Final Nutrition Goals Re-Evaluation):   Psychosocial: Target Goals: Acknowledge presence or absence of significant depression and/or stress, maximize coping skills, provide positive support system. Participant is able to verbalize types and ability to use techniques and skills needed for reducing stress and depression.   Education: Stress, Anxiety, and Depression - Group verbal and visual presentation to define topics covered.  Reviews how body is impacted by stress, anxiety, and depression.  Also  discusses healthy ways to reduce stress and to treat/manage anxiety and depression.  Written material given at graduation.   Education: Sleep Hygiene -Provides group verbal and written instruction about how sleep can affect your health.  Define sleep hygiene, discuss sleep cycles and impact of sleep habits. Review good sleep hygiene tips.    Initial Review & Psychosocial Screening:  Initial Psych Review & Screening - 08/20/23 1018       Initial Review   Current issues with None Identified      Family Dynamics   Good Support System? Yes    Comments Eaton can look to his wife and son for support. He reports no mental instabilities and does not take anything for his mood.      Barriers   Psychosocial barriers to participate in program  The patient should benefit from training in stress management and relaxation.;There are no identifiable barriers or psychosocial needs.      Screening Interventions   Interventions Encouraged to exercise;To provide support and resources with identified psychosocial needs;Provide feedback about the scores to participant;Program counselor consult    Expected Outcomes Short Term goal: Utilizing psychosocial counselor, staff and physician to assist with identification of specific Stressors or current issues interfering with healing process. Setting desired goal for each stressor or current issue identified.;Long Term Goal: Stressors or current issues are controlled or eliminated.;Short Term goal: Identification and review with participant of any Quality of Life or Depression concerns found by scoring the questionnaire.;Long Term goal: The participant improves quality of Life and PHQ9 Scores as seen by post scores and/or verbalization of changes             Quality of Life Scores:   Quality of Life - 08/29/23 0945       Quality of Life   Select Quality of Life      Quality of Life Scores   Health/Function Pre 28.71 %    Socioeconomic Pre 30 %     Psych/Spiritual Pre 28.29 %    Family Pre 30 %    GLOBAL Pre 29.06 %            Scores of 19 and below usually indicate a poorer quality of life in these areas.  A difference of  2-3 points is a clinically meaningful difference.  A difference of 2-3 points in the total score of the Quality of Life Index has been associated with significant improvement in overall quality of life, self-image, physical symptoms, and general health in studies assessing change in quality of life.  PHQ-9: Review Flowsheet       08/27/2023  Depression screen PHQ 2/9  Decreased Interest 1  Down, Depressed, Hopeless 0  PHQ - 2 Score 1  Altered sleeping 0  Tired, decreased energy 1  Change in appetite 0  Feeling bad or failure about yourself  0  Trouble concentrating 0  Moving slowly or fidgety/restless 0  Suicidal thoughts 0  PHQ-9 Score 2  Difficult doing work/chores Not difficult at all   Interpretation of Total Score  Total Score Depression Severity:  1-4 = Minimal depression, 5-9 = Mild depression, 10-14 = Moderate depression, 15-19 = Moderately severe depression, 20-27 = Severe depression   Psychosocial Evaluation and Intervention:  Psychosocial Evaluation - 08/20/23 1019       Psychosocial Evaluation & Interventions   Interventions Encouraged to exercise with the program and follow exercise prescription;Relaxation education;Stress management education    Comments Wiliam can look to his wife and son for support. He reports no mental instabilities and does not take anything for his mood.    Expected Outcomes Short: Start [facility] to help with mood. Long: Maintain a healthy mental state    Continue Psychosocial Services  Follow up required by staff             Psychosocial Re-Evaluation:   Psychosocial Discharge (Final Psychosocial Re-Evaluation):   Vocational Rehabilitation: Provide vocational rehab assistance to qualifying candidates.   Vocational Rehab Evaluation &  Intervention:   Education: Education Goals: Education classes will be provided on a variety of topics geared toward better understanding of heart health and risk factor modification. Participant will state understanding/return demonstration of topics presented as noted by education test scores.  Learning Barriers/Preferences:  Learning Barriers/Preferences - 08/20/23 1018  Learning Barriers/Preferences   Learning Barriers None    Learning Preferences None             General Cardiac Education Topics:  AED/CPR: - Group verbal and written instruction with the use of models to demonstrate the basic use of the AED with the basic ABC's of resuscitation.   Anatomy and Cardiac Procedures: - Group verbal and visual presentation and models provide information about basic cardiac anatomy and function. Reviews the testing methods done to diagnose heart disease and the outcomes of the test results. Describes the treatment choices: Medical Management, Angioplasty, or Coronary Bypass Surgery for treating various heart conditions including Myocardial Infarction, Angina, Valve Disease, and Cardiac Arrhythmias.  Written material given at graduation. Flowsheet Row Cardiac Rehab from 09/05/2023 in [facility] Cardiac and Pulmonary Rehab  Date 09/05/23  Educator sb  Instruction Review Code 1- Verbalizes Understanding       Medication Safety: - Group verbal and visual instruction to review commonly prescribed medications for heart and lung disease. Reviews the medication, class of the drug, and side effects. Includes the steps to properly store meds and maintain the prescription regimen.  Written material given at graduation.   Intimacy: - Group verbal instruction through game format to discuss how heart and lung disease can affect sexual intimacy. Written material given at graduation..   Know Your Numbers and Heart Failure: - Group verbal and visual instruction to discuss disease risk factors  for cardiac and pulmonary disease and treatment options.  Reviews associated critical values for Overweight/Obesity, Hypertension, Cholesterol, and Diabetes.  Discusses basics of heart failure: signs/symptoms and treatments.  Introduces Heart Failure Zone chart for action plan for heart failure.  Written material given at graduation.   Infection Prevention: - Provides verbal and written material to individual with discussion of infection control including proper hand washing and proper equipment cleaning during exercise session. Flowsheet Row Cardiac Rehab from 09/05/2023 in [facility] Cardiac and Pulmonary Rehab  Date 08/20/23  Educator [facility]  Instruction Review Code 1- Verbalizes Understanding       Falls Prevention: - Provides verbal and written material to individual with discussion of falls prevention and safety. Flowsheet Row Cardiac Rehab from 09/05/2023 in [facility] Cardiac and Pulmonary Rehab  Date 08/20/23  Educator [facility]  Instruction Review Code 1- Verbalizes Understanding       Other: -Provides group and verbal instruction on various topics (see comments)   Knowledge Questionnaire Score:  Knowledge Questionnaire Score - 08/29/23 0948       Knowledge Questionnaire Score   Pre Score 24/26             Core Components/Risk Factors/Patient Goals at Admission:  Personal Goals and Risk Factors at Admission - 08/20/23 1017       Core Components/Risk Factors/Patient Goals on Admission    Weight Management Yes;Weight Gain    Intervention Weight Management: Develop a combined nutrition and exercise program designed to reach desired caloric intake, while maintaining appropriate intake of nutrient and fiber, sodium and fats, and appropriate energy expenditure required for the weight goal.;Weight Management: Provide education and appropriate resources to help participant work on and attain dietary goals.;Weight Management/Obesity: Establish reasonable short term and long term weight goals.     Expected Outcomes Short Term: Continue to assess and modify interventions until short term weight is achieved;Weight Maintenance: Understanding of the daily nutrition guidelines, which includes 25-35% calories from fat, 7% or less cal from saturated fats, less than 200mg  cholesterol, less than 1.5gm of sodium, &  5 or more servings of fruits and vegetables daily;Understanding recommendations for meals to include 15-35% energy as protein, 25-35% energy from fat, 35-60% energy from carbohydrates, less than 200mg  of dietary cholesterol, 20-35 gm of total fiber daily;Understanding of distribution of calorie intake throughout the day with the consumption of 4-5 meals/snacks    Hypertension Yes    Intervention Provide education on lifestyle modifcations including regular physical activity/exercise, weight management, moderate sodium restriction and increased consumption of fresh fruit, vegetables, and low fat dairy, alcohol moderation, and smoking cessation.;Monitor prescription use compliance.    Expected Outcomes Short Term: Continued assessment and intervention until BP is < 140/71mm HG in hypertensive participants. < 130/72mm HG in hypertensive participants with diabetes, heart failure or chronic kidney disease.;Long Term: Maintenance of blood pressure at goal levels.    Lipids Yes    Intervention Provide education and support for participant on nutrition & aerobic/resistive exercise along with prescribed medications to achieve LDL 70mg , HDL >40mg .    Expected Outcomes Short Term: Participant states understanding of desired cholesterol values and is compliant with medications prescribed. Participant is following exercise prescription and nutrition guidelines.;Long Term: Cholesterol controlled with medications as prescribed, with individualized exercise RX and with personalized nutrition plan. Value goals: LDL < 70mg , HDL > 40 mg.             Education:Diabetes - Individual verbal and written instruction  to review signs/symptoms of diabetes, desired ranges of glucose level fasting, after meals and with exercise. Acknowledge that pre and post exercise glucose checks will be done for 3 sessions at entry of program.   Core Components/Risk Factors/Patient Goals Review:    Core Components/Risk Factors/Patient Goals at Discharge (Final Review):    ITP Comments:  ITP Comments     Row Name 08/20/23 1017 08/27/23 1407 08/29/23 0935 09/05/23 1126     ITP Comments Virtual Visit completed. Patient informed on EP and RD appointment and 6 Minute walk test. Patient also informed of patient health questionnaires on My Chart. Patient Verbalizes understanding. Visit diagnosis can be found in [facility] 08/01/2023. Completed and gym orientation. Initial ITP created and sent for review to Dr. Bethann Punches, Medical Director. First full day of exercise!  Patient was oriented to gym and equipment including functions, settings, policies, and procedures.  Patient's individual exercise prescription and treatment plan were reviewed.  All starting workloads were established based on the results of the 6 minute walk test done at initial orientation visit.  The plan for exercise progression was also introduced and progression will be customized based on patient's performance and goals. 30 Day review completed. Medical Director ITP review done, changes made as directed, and signed approval by Medical Director.    new to program             Comments:

## 2023-09-07 ENCOUNTER — Encounter: Admitting: *Deleted

## 2023-09-07 DIAGNOSIS — Z951 Presence of aortocoronary bypass graft: Secondary | ICD-10-CM | POA: Diagnosis not present

## 2023-09-07 NOTE — Progress Notes (Signed)
 Daily Session Note  Patient Details  Name: Devin Duke MRN: [id number] Date of Birth: 1946/03/15 Referring Provider:   Flowsheet Row Cardiac Rehab from 08/27/2023 in [facility] Cardiac and Pulmonary Rehab  Referring Provider Dr. Daniel Nones, MD       Encounter Date: 09/07/2023  Check In:  Session Check In - 09/07/23 0935       Check-In   Supervising physician immediately available to respond to emergencies See telemetry face sheet for immediately available ER MD    Location [facility]-Cardiac & Pulmonary Rehab    Staff Present Cora Collum, RN, BSN, CCRP;Joseph Hood RCP,RRT,BSRT;Noah Tickle, [place], Exercise Physiologist;Maxon Conetta BS, Exercise Physiologist    Virtual Visit No    Medication changes reported     No    Fall or balance concerns reported    No    Warm-up and Cool-down Performed on first and last piece of equipment    Resistance Training Performed Yes    VAD Patient? No    PAD/SET Patient? No      Pain Assessment   Currently in Pain? No/denies                Social History   Tobacco Use  Smoking Status Former   Current packs/day: 0.00   Types: Cigarettes   Quit date: 09/07/1980   Years since quitting: 43.0  Smokeless Tobacco Not on file    Goals Met:  Independence with exercise equipment Exercise tolerated well No report of concerns or symptoms today  Goals Unmet:  Not Applicable  Comments: Pt able to follow exercise prescription today without complaint.  Will continue to monitor for progression.    Dr. Bethann Punches is Medical Director for [facility] Cardiac Rehabilitation.  Dr. Vida Rigger is Medical Director for [facility] Pulmonary Rehabilitation.

## 2023-09-10 ENCOUNTER — Encounter: Admitting: *Deleted

## 2023-09-10 DIAGNOSIS — Z951 Presence of aortocoronary bypass graft: Secondary | ICD-10-CM

## 2023-09-10 NOTE — Progress Notes (Signed)
 Daily Session Note  Patient Details  Name: Devin Duke MRN: [id number] Date of Birth: [birth year] Referring Provider:   Flowsheet Row Cardiac Rehab from 08/27/2023 in [facility] Cardiac and Pulmonary Rehab  Referring Provider Dr. Eddy Goodell, MD       Encounter Date: 09/10/2023  Check In:  Session Check In - 09/10/23 0931       Check-In   Supervising physician immediately available to respond to emergencies See telemetry face sheet for immediately available ER MD    Location [facility]-Cardiac & Pulmonary Rehab    Staff Present Maud Sorenson, RN, BSN, CCRP;Margaret Best, MS, Exercise Physiologist;Maxon Conetta BS, Exercise Physiologist;Kelly Dawne Euler, ACSM CEP, Exercise Physiologist    Virtual Visit No    Medication changes reported     No    Fall or balance concerns reported    No    Warm-up and Cool-down Performed on first and last piece of equipment    Resistance Training Performed Yes    VAD Patient? No    PAD/SET Patient? No      Pain Assessment   Currently in Pain? No/denies                Social History   Tobacco Use  Smoking Status Former   Current packs/day: 0.00   Types: Cigarettes   Quit date: 09/07/1980   Years since quitting: 43.0  Smokeless Tobacco Not on file    Goals Met:  Independence with exercise equipment Exercise tolerated well No report of concerns or symptoms today  Goals Unmet:  Not Applicable  Comments: Pt able to follow exercise prescription today without complaint.  Will continue to monitor for progression.    Dr. Firman Hughes is Medical Director for [facility] Cardiac Rehabilitation.  Dr. Fuad Aleskerov is Medical Director for [facility] Pulmonary Rehabilitation.

## 2023-09-12 ENCOUNTER — Encounter: Admitting: *Deleted

## 2023-09-12 DIAGNOSIS — Z951 Presence of aortocoronary bypass graft: Secondary | ICD-10-CM | POA: Diagnosis not present

## 2023-09-12 NOTE — Progress Notes (Signed)
 Daily Session Note  Patient Details  Name: Devin Duke MRN: [id number] Date of Birth: [birth year] Referring Provider:   Flowsheet Row Cardiac Rehab from 08/27/2023 in [facility] Cardiac and Pulmonary Rehab  Referring Provider Dr. Eddy Goodell, MD       Encounter Date: 09/12/2023  Check In:  Session Check In - 09/12/23 1043       Check-In   Supervising physician immediately available to respond to emergencies See telemetry face sheet for immediately available ER MD    Location [facility]-Cardiac & Pulmonary Rehab    Staff Present Lyell Samuel, MS, Exercise Physiologist;Maxon Conetta BS, Exercise Physiologist;Kaylin Schellenberg, RN, BSN, CCRP    Virtual Visit No    Medication changes reported     No    Fall or balance concerns reported    No    Warm-up and Cool-down Performed on first and last piece of equipment    Resistance Training Performed Yes    VAD Patient? No    PAD/SET Patient? No      Pain Assessment   Currently in Pain? No/denies                Social History   Tobacco Use  Smoking Status Former   Current packs/day: 0.00   Types: Cigarettes   Quit date: 09/07/1980   Years since quitting: 43.0  Smokeless Tobacco Not on file    Goals Met:  Independence with exercise equipment Exercise tolerated well No report of concerns or symptoms today  Goals Unmet:  Not Applicable  Comments: Pt able to follow exercise prescription today without complaint.  Will continue to monitor for progression.    Dr. Firman Hughes is Medical Director for [facility] Cardiac Rehabilitation.  Dr. Fuad Aleskerov is Medical Director for [facility] Pulmonary Rehabilitation.

## 2023-09-14 ENCOUNTER — Encounter: Admitting: *Deleted

## 2023-09-14 DIAGNOSIS — Z951 Presence of aortocoronary bypass graft: Secondary | ICD-10-CM | POA: Diagnosis not present

## 2023-09-14 NOTE — Progress Notes (Signed)
 Daily Session Note  Patient Details  Name: Devin Duke MRN: [id number] Date of Birth: 19-Aug-1945 Referring Provider:   Flowsheet Row Cardiac Rehab from 08/27/2023 in [facility] Cardiac and Pulmonary Rehab  Referring Provider Dr. Eddy Goodell, MD       Encounter Date: 09/14/2023  Check In:  Session Check In - 09/14/23 1031       Check-In   Supervising physician immediately available to respond to emergencies See telemetry face sheet for immediately available ER MD    Location [facility]-Cardiac & Pulmonary Rehab    Staff Present Maxon Conetta BS, Exercise Physiologist;Landra Howze RN,BSN;Noah Tickle, BS, Exercise Physiologist;Kelly Dawne Euler, ACSM CEP, Exercise Physiologist    Virtual Visit No    Medication changes reported     No    Fall or balance concerns reported    No    Warm-up and Cool-down Performed on first and last piece of equipment    Resistance Training Performed Yes    VAD Patient? No    PAD/SET Patient? No      Pain Assessment   Currently in Pain? No/denies    Multiple Pain Sites No                Social History   Tobacco Use  Smoking Status Former   Current packs/day: 0.00   Types: Cigarettes   Quit date: 09/07/1980   Years since quitting: 43.0  Smokeless Tobacco Not on file    Goals Met:  Independence with exercise equipment Exercise tolerated well No report of concerns or symptoms today Strength training completed today  Goals Unmet:  Not Applicable  Comments: Pt able to follow exercise prescription today without complaint.  Will continue to monitor for progression.    Dr. Firman Hughes is Medical Director for [facility] Cardiac Rehabilitation.  Dr. Fuad Aleskerov is Medical Director for [facility] Pulmonary Rehabilitation.

## 2023-09-17 ENCOUNTER — Encounter: Admitting: *Deleted

## 2023-09-17 DIAGNOSIS — Z951 Presence of aortocoronary bypass graft: Secondary | ICD-10-CM

## 2023-09-17 NOTE — Progress Notes (Signed)
 Daily Session Note  Patient Details  Name: Devin Duke MRN: [id number] Date of Birth: 08/28/1945 Referring Provider:   Flowsheet Row Cardiac Rehab from 08/27/2023 in [facility] Cardiac and Pulmonary Rehab  Referring Provider Dr. Eddy Goodell, MD       Encounter Date: 09/17/2023  Check In:  Session Check In - 09/17/23 0932       Check-In   Supervising physician immediately available to respond to emergencies See telemetry face sheet for immediately available ER MD    Location [facility]-Cardiac & Pulmonary Rehab    Staff Present Maud Sorenson, RN, BSN, CCRP;Joseph Hood RCP,RRT,BSRT;Kelly [place] BS, ACSM CEP, Exercise Physiologist;Maxon Conetta BS, Exercise Physiologist    Virtual Visit No    Medication changes reported     No    Fall or balance concerns reported    No    Warm-up and Cool-down Performed on first and last piece of equipment    Resistance Training Performed Yes    VAD Patient? No    PAD/SET Patient? No      Pain Assessment   Currently in Pain? No/denies                Social History   Tobacco Use  Smoking Status Former   Current packs/day: 0.00   Types: Cigarettes   Quit date: 09/07/1980   Years since quitting: 43.0  Smokeless Tobacco Not on file    Goals Met:  Independence with exercise equipment Exercise tolerated well No report of concerns or symptoms today  Goals Unmet:  Not Applicable  Comments: Pt able to follow exercise prescription today without complaint.  Will continue to monitor for progression.    Dr. Firman Hughes is Medical Director for [facility] Cardiac Rehabilitation.  Dr. Fuad Aleskerov is Medical Director for [facility] Pulmonary Rehabilitation.

## 2023-09-19 ENCOUNTER — Encounter: Admitting: *Deleted

## 2023-09-19 DIAGNOSIS — Z951 Presence of aortocoronary bypass graft: Secondary | ICD-10-CM

## 2023-09-19 NOTE — Progress Notes (Signed)
 Daily Session Note  Patient Details  Name: Devin Duke MRN: [id number] Date of Birth: 1946/05/13 Referring Provider:   Flowsheet Row Cardiac Rehab from 08/27/2023 in [facility] Cardiac and Pulmonary Rehab  Referring Provider Dr. Eddy Goodell, MD       Encounter Date: 09/19/2023  Check In:  Session Check In - 09/19/23 0934       Check-In   Supervising physician immediately available to respond to emergencies See telemetry face sheet for immediately available ER MD    Location [facility]-Cardiac & Pulmonary Rehab    Staff Present Lyell Samuel, MS, Exercise Physiologist;Maxon Conetta BS, Exercise Physiologist;[employer];Maud Sorenson, RN, BSN, CCRP    Virtual Visit No    Medication changes reported     No    Fall or balance concerns reported    No    Warm-up and Cool-down Performed on first and last piece of equipment    Resistance Training Performed Yes    VAD Patient? No    PAD/SET Patient? No      Pain Assessment   Currently in Pain? No/denies                Social History   Tobacco Use  Smoking Status Former   Current packs/day: 0.00   Types: Cigarettes   Quit date: 09/07/1980   Years since quitting: 43.0  Smokeless Tobacco Not on file    Goals Met:  Independence with exercise equipment Exercise tolerated well No report of concerns or symptoms today  Goals Unmet:  Not Applicable  Comments: Pt able to follow exercise prescription today without complaint.  Will continue to monitor for progression.    Dr. Firman Hughes is Medical Director for [facility] Cardiac Rehabilitation.  Dr. Fuad Aleskerov is Medical Director for [facility] Pulmonary Rehabilitation.

## 2023-09-21 ENCOUNTER — Encounter: Admitting: *Deleted

## 2023-09-21 DIAGNOSIS — Z951 Presence of aortocoronary bypass graft: Secondary | ICD-10-CM

## 2023-09-21 NOTE — Progress Notes (Signed)
 Daily Session Note  Patient Details  Name: Devin Duke MRN: [id number] Date of Birth: January 13, 1946 Referring Provider:   Flowsheet Row Cardiac Rehab from 08/27/2023 in [facility] Cardiac and Pulmonary Rehab  Referring Provider Dr. Eddy Goodell, MD       Encounter Date: 09/21/2023  Check In:  Session Check In - 09/21/23 0928       Check-In   Supervising physician immediately available to respond to emergencies See telemetry face sheet for immediately available ER MD    Location [facility]-Cardiac & Pulmonary Rehab    Staff Present Maud Sorenson, RN, BSN, CCRP;Joseph Hood RCP,RRT,BSRT;Maxon [place] BS, Exercise Physiologist;Noah Tickle, BS, Exercise Physiologist    Virtual Visit No    Medication changes reported     No    Fall or balance concerns reported    No    Warm-up and Cool-down Performed on first and last piece of equipment    Resistance Training Performed Yes    VAD Patient? No    PAD/SET Patient? No      Pain Assessment   Currently in Pain? No/denies                Social History   Tobacco Use  Smoking Status Former   Current packs/day: 0.00   Types: Cigarettes   Quit date: 09/07/1980   Years since quitting: 43.0  Smokeless Tobacco Not on file    Goals Met:  Independence with exercise equipment Exercise tolerated well No report of concerns or symptoms today  Goals Unmet:  Not Applicable  Comments: Pt able to follow exercise prescription today without complaint.  Will continue to monitor for progression.    Dr. Firman Hughes is Medical Director for [facility] Cardiac Rehabilitation.  Dr. Fuad Aleskerov is Medical Director for [facility] Pulmonary Rehabilitation.

## 2023-09-24 ENCOUNTER — Encounter: Admitting: *Deleted

## 2023-09-24 ENCOUNTER — Encounter

## 2023-09-24 DIAGNOSIS — Z951 Presence of aortocoronary bypass graft: Secondary | ICD-10-CM

## 2023-09-24 NOTE — Progress Notes (Signed)
 Assessment start time: 10:17 AM  Digestive issues/concerns: no known food allergies   24-hours Recall: B: banana nut muffin Snack/lunch: swiss cheese and crackers D: salad - kale with chicken OR sweet potato, green beans, fish  Beverages water (32oz), sometimes ginger ale Alcohol: ~1 drink per month   Education r/t nutrition plan Patient drinking mostly water, but not much of it. Says he has been working on drinking more, currently ~32oz per day. Set goal to drink 48-64oz daily. His wife is nutrition conscious, saying that she keeps him following a heart healthy diet. He doesn't read labels, but tries not to eat much processed foods, instead his wife provides him lots of fruits, veggies, and whole grains. He says he could do better with eating fruit. Reviewed mediterranean diet handout. Educated on types of fats, sources, and how to read labels. Provided balanced plates handout, recommended he eat more protein than his recall suggested. Brainstormed ways to include more protein at meals and snacks.   Goal 1: Include protein at meals Goal 2: Drink 48-64oz of water Goal 3: Include a serving of fruit each day  End time 10:49 AM

## 2023-09-24 NOTE — Progress Notes (Signed)
 Daily Session Note  Patient Details  Name: Devin Duke MRN: [id number] Date of Birth: 1945/07/12 Referring Provider:   Flowsheet Row Cardiac Rehab from 08/27/2023 in [facility] Cardiac and Pulmonary Rehab  Referring Provider Dr. Eddy Goodell, MD       Encounter Date: 09/24/2023  Check In:  Session Check In - 09/24/23 0930       Check-In   Supervising physician immediately available to respond to emergencies See telemetry face sheet for immediately available ER MD    Location [facility]-Cardiac & Pulmonary Rehab    Staff Present Maud Sorenson, RN, BSN, CCRP;Joseph Hood RCP,RRT,BSRT;Maxon [place] BS, Exercise Physiologist;Kelly [place] BS, ACSM CEP, Exercise Physiologist;Jason Martina Sledge RDN,LDN    Virtual Visit No    Medication changes reported     No    Fall or balance concerns reported    No    Warm-up and Cool-down Performed on first and last piece of equipment    Resistance Training Performed Yes    VAD Patient? No    PAD/SET Patient? No      Pain Assessment   Currently in Pain? No/denies                Social History   Tobacco Use  Smoking Status Former   Current packs/day: 0.00   Types: Cigarettes   Quit date: 09/07/1980   Years since quitting: 43.0  Smokeless Tobacco Not on file    Goals Met:  Independence with exercise equipment Exercise tolerated well No report of concerns or symptoms today  Goals Unmet:  Not Applicable  Comments: Pt able to follow exercise prescription today without complaint.  Will continue to monitor for progression.    Dr. Firman Hughes is Medical Director for [facility] Cardiac Rehabilitation.  Dr. Fuad Aleskerov is Medical Director for [facility] Pulmonary Rehabilitation.

## 2023-09-26 ENCOUNTER — Encounter: Admitting: *Deleted

## 2023-09-26 DIAGNOSIS — Z951 Presence of aortocoronary bypass graft: Secondary | ICD-10-CM

## 2023-09-26 NOTE — Progress Notes (Signed)
 Daily Session Note  Patient Details  Name: LYRIK DICKOW MRN: [id number] Date of Birth: Mar 27, 1946 Referring Provider:   Flowsheet Row Cardiac Rehab from 08/27/2023 in [facility] Cardiac and Pulmonary Rehab  Referring Provider Dr. Eddy Goodell, MD       Encounter Date: 09/26/2023  Check In:  Session Check In - 09/26/23 0937       Check-In   Supervising physician immediately available to respond to emergencies See telemetry face sheet for immediately available ER MD    Location [facility]-Cardiac & Pulmonary Rehab    Staff Present Maxon Conetta BS, Exercise Physiologist;Noah Tickle, BS, Exercise Physiologist;Joseph Hood RCP,RRT,BSRT;Maud Sorenson, RN, BSN, CCRP    Virtual Visit No    Medication changes reported     No    Fall or balance concerns reported    No    Warm-up and Cool-down Performed on first and last piece of equipment    Resistance Training Performed Yes    VAD Patient? No    PAD/SET Patient? No      Pain Assessment   Currently in Pain? No/denies                Social History   Tobacco Use  Smoking Status Former   Current packs/day: 0.00   Types: Cigarettes   Quit date: 09/07/1980   Years since quitting: 43.0  Smokeless Tobacco Not on file    Goals Met:  Independence with exercise equipment Exercise tolerated well No report of concerns or symptoms today  Goals Unmet:  Not Applicable  Comments: Pt able to follow exercise prescription today without complaint.  Will continue to monitor for progression.    Dr. Firman Hughes is Medical Director for [facility] Cardiac Rehabilitation.  Dr. Fuad Aleskerov is Medical Director for [facility] Pulmonary Rehabilitation.

## 2023-09-28 ENCOUNTER — Encounter: Attending: Internal Medicine | Admitting: *Deleted

## 2023-09-28 DIAGNOSIS — Z951 Presence of aortocoronary bypass graft: Secondary | ICD-10-CM | POA: Insufficient documentation

## 2023-09-28 NOTE — Progress Notes (Signed)
 Daily Session Note  Patient Details  Name: RUHAAN TAUBMAN MRN: [id number] Date of Birth: 11-08-1945 Referring Provider:   Flowsheet Row Cardiac Rehab from 08/27/2023 in [facility] Cardiac and Pulmonary Rehab  Referring Provider Dr. Eddy Goodell, MD       Encounter Date: 09/28/2023  Check In:  Session Check In - 09/28/23 0924       Check-In   Supervising physician immediately available to respond to emergencies See telemetry face sheet for immediately available ER MD    Location [facility]-Cardiac & Pulmonary Rehab    Staff Present Maud Sorenson, RN, BSN, CCRP;Joseph Hood RCP,RRT,BSRT;Maxon [place] BS, Exercise Physiologist;Noah Tickle, BS, Exercise Physiologist    Virtual Visit No    Medication changes reported     No    Fall or balance concerns reported    No    Warm-up and Cool-down Performed on first and last piece of equipment    Resistance Training Performed Yes    VAD Patient? No    PAD/SET Patient? No      Pain Assessment   Currently in Pain? No/denies                Social History   Tobacco Use  Smoking Status Former   Current packs/day: 0.00   Types: Cigarettes   Quit date: 09/07/1980   Years since quitting: 43.0  Smokeless Tobacco Not on file    Goals Met:  Independence with exercise equipment Exercise tolerated well No report of concerns or symptoms today  Goals Unmet:  Not Applicable  Comments: Pt able to follow exercise prescription today without complaint.  Will continue to monitor for progression.    Dr. Firman Hughes is Medical Director for [facility] Cardiac Rehabilitation.  Dr. Fuad Aleskerov is Medical Director for [facility] Pulmonary Rehabilitation.

## 2023-10-01 ENCOUNTER — Encounter: Admitting: *Deleted

## 2023-10-01 DIAGNOSIS — Z951 Presence of aortocoronary bypass graft: Secondary | ICD-10-CM | POA: Diagnosis not present

## 2023-10-01 NOTE — Progress Notes (Signed)
 Daily Session Note  Patient Details  Name: Devin Duke MRN: [id number] Date of Birth: February 20, 1946 Referring Provider:   Flowsheet Row Cardiac Rehab from 08/27/2023 in [facility] Cardiac and Pulmonary Rehab  Referring Provider Dr. Eddy Goodell, MD       Encounter Date: 10/01/2023  Check In:  Session Check In - 10/01/23 0931       Check-In   Supervising physician immediately available to respond to emergencies See telemetry face sheet for immediately available ER MD    Location [facility]-Cardiac & Pulmonary Rehab    Staff Present Maud Sorenson, RN, BSN, CCRP;Joseph Hood RCP,RRT,BSRT;Maxon Princeton BS, Exercise Physiologist;Kelly [employer], ACSM CEP, Exercise Physiologist    Virtual Visit No    Medication changes reported     No    Fall or balance concerns reported    No    Warm-up and Cool-down Performed on first and last piece of equipment    Resistance Training Performed Yes    VAD Patient? No    PAD/SET Patient? No      Pain Assessment   Currently in Pain? No/denies                Social History   Tobacco Use  Smoking Status Former   Current packs/day: 0.00   Types: Cigarettes   Quit date: 09/07/1980   Years since quitting: 43.0  Smokeless Tobacco Not on file    Goals Met:  Independence with exercise equipment Exercise tolerated well No report of concerns or symptoms today  Goals Unmet:  Not Applicable  Comments: Pt able to follow exercise prescription today without complaint.  Will continue to monitor for progression.    Dr. Firman Hughes is Medical Director for [facility] Cardiac Rehabilitation.  Dr. Fuad Aleskerov is Medical Director for [facility] Pulmonary Rehabilitation.

## 2023-10-03 ENCOUNTER — Encounter: Admitting: *Deleted

## 2023-10-03 DIAGNOSIS — Z951 Presence of aortocoronary bypass graft: Secondary | ICD-10-CM | POA: Diagnosis not present

## 2023-10-03 NOTE — Progress Notes (Signed)
 30 Day review completed. Medical Director ITP review done, changes made as directed, and signed approval by Medical Director. ? ?

## 2023-10-03 NOTE — Progress Notes (Signed)
 Daily Session Note  Patient Details  Name: Devin Duke MRN: [id number] Date of Birth: [birth year] Referring Provider:   Flowsheet Row Cardiac Rehab from 08/27/2023 in [facility] Cardiac and Pulmonary Rehab  Referring Provider Dr. Eddy Goodell, MD       Encounter Date: 10/03/2023  Check In:  Session Check In - 10/03/23 0941       Check-In   Supervising physician immediately available to respond to emergencies See telemetry face sheet for immediately available ER MD    Location [facility]-Cardiac & Pulmonary Rehab    Staff Present Maud Sorenson, RN, BSN, CCRP;Meredith Manson Seitz RN,BSN;Joseph Hood RCP,RRT,BSRT;Maxon Adair BS, Exercise Physiologist;Laureen Bevin Bucks, BS, RRT, CPFT    Virtual Visit No    Medication changes reported     No    Fall or balance concerns reported    No    Warm-up and Cool-down Performed on first and last piece of equipment    Resistance Training Performed Yes    VAD Patient? No    PAD/SET Patient? No      Pain Assessment   Currently in Pain? No/denies                Social History   Tobacco Use  Smoking Status Former   Current packs/day: 0.00   Types: Cigarettes   Quit date: 09/07/1980   Years since quitting: 43.0  Smokeless Tobacco Not on file    Goals Met:  Independence with exercise equipment Exercise tolerated well No report of concerns or symptoms today  Goals Unmet:  Not Applicable  Comments: Pt able to follow exercise prescription today without complaint.  Will continue to monitor for progression.    Dr. Firman Hughes is Medical Director for [facility] Cardiac Rehabilitation.  Dr. Fuad Aleskerov is Medical Director for [facility] Pulmonary Rehabilitation.

## 2023-10-03 NOTE — Progress Notes (Signed)
 Cardiac Individual Treatment Plan  Patient Details  Name: Devin Duke MRN: [id number] Date of Birth: [birth year] Referring Provider:   Flowsheet Row Cardiac Rehab from 08/27/2023 in [facility] Cardiac and Pulmonary Rehab  Referring Provider Dr. Eddy Goodell, MD       Initial Encounter Date:  Flowsheet Row Cardiac Rehab from 08/27/2023 in [facility] Cardiac and Pulmonary Rehab  Date 08/27/23       Visit Diagnosis: S/P CABG x 3  Patient's Home Medications on Admission:  Current Outpatient Medications:    amiodarone (PACERONE) 200 MG tablet, Take by mouth., Disp: , Rfl:    amLODipine (NORVASC) 10 MG tablet, Take by mouth. (Patient not taking: Reported on 08/20/2023), Disp: , Rfl:    amLODipine-benazepril (LOTREL) 5-20 MG capsule, 1 capsule Orally Once a day for 90 (Patient not taking: Reported on 08/20/2023), Disp: , Rfl:    amLODipine-benazepril (LOTREL) 5-20 MG per capsule, Take 1 capsule by mouth every morning.  (Patient not taking: Reported on 08/20/2023), Disp: , Rfl:    aspirin EC 81 MG tablet, Take by mouth., Disp: , Rfl:    furosemide (LASIX) 20 MG tablet, Take by mouth., Disp: , Rfl:    isosorbide mononitrate (IMDUR) 30 MG 24 hr tablet, Take 30 mg by mouth daily., Disp: , Rfl:    metoprolol  tartrate (LOPRESSOR ) 25 MG tablet, Take by mouth., Disp: , Rfl:    Multiple Vitamins-Minerals (PRESERVISION AREDS 2 PO), Take by mouth., Disp: , Rfl:    nitroGLYCERIN  (NITROSTAT ) 0.4 MG SL tablet, Place under the tongue., Disp: , Rfl:    potassium chloride SA (KLOR-CON M) 20 MEQ tablet, Take by mouth., Disp: , Rfl:    rosuvastatin (CRESTOR) 40 MG tablet, Take 40 mg by mouth daily., Disp: , Rfl:   Past Medical History: Past Medical History:  Diagnosis Date   Colon polyp    GERD (gastroesophageal reflux disease)    History of kidney stones    x1    Hypertension    Prostate CA (HCC)    PSA elevation    slow [facility]   Shoulder arthritis     limited ROM/pain   Tinnitus     Tobacco Use: Social History   Tobacco Use  Smoking Status Former   Current packs/day: 0.00   Types: Cigarettes   Quit date: 09/07/1980   Years since quitting: 43.0  Smokeless Tobacco Not on file    Labs: Review Flowsheet        No data to display           Exercise Target Goals: Exercise Program Goal: Individual exercise prescription set using results from initial 6 min walk test and THRR while considering  patient's activity barriers and safety.   Exercise Prescription Goal: Initial exercise prescription builds to 30-45 minutes a day of aerobic activity, 2-3 days per week.  Home exercise guidelines will be given to patient during program as part of exercise prescription that the participant will acknowledge.   Education: Aerobic Exercise: - Group verbal and visual presentation on the components of exercise prescription. Introduces F.I.T.T principle from ACSM for exercise prescriptions.  Reviews F.I.T.T. principles of aerobic exercise including progression. Written material given at graduation. Flowsheet Row Cardiac Rehab from 10/03/2023 in [facility] Cardiac and Pulmonary Rehab  Education need identified 08/29/23       Education: Resistance Exercise: - Group verbal and visual presentation on the components of exercise prescription. Introduces F.I.T.T principle from ACSM for exercise prescriptions  Reviews  F.I.T.T. principles of resistance exercise including progression. Written material given at graduation.    Education: Exercise & Equipment Safety: - Individual verbal instruction and demonstration of equipment use and safety with use of the equipment. Flowsheet Row Cardiac Rehab from 10/03/2023 in [facility] Cardiac and Pulmonary Rehab  Date 08/20/23  Educator [facility]  Instruction Review Code 1- Verbalizes Understanding       Education: Exercise Physiology & General Exercise Guidelines: - Group verbal and written instruction with models to review  the exercise physiology of the cardiovascular system and associated critical values. Provides general exercise guidelines with specific guidelines to those with heart or lung disease.    Education: Flexibility, Balance, Mind/Body Relaxation: - Group verbal and visual presentation with interactive activity on the components of exercise prescription. Introduces F.I.T.T principle from ACSM for exercise prescriptions. Reviews F.I.T.T. principles of flexibility and balance exercise training including progression. Also discusses the mind body connection.  Reviews various relaxation techniques to help reduce and manage stress (i.e. Deep breathing, progressive muscle relaxation, and visualization). Balance handout provided to take home. Written material given at graduation.   Activity Barriers & Risk Stratification:  Activity Barriers & Cardiac Risk Stratification - 08/27/23 1551       Activity Barriers & Cardiac Risk Stratification   Activity Barriers Joint Problems   L shoulder pain, limited ROM   Cardiac Risk Stratification High             6 Minute Walk:  6 Minute Walk     Row Name 08/27/23 1550         6 Minute Walk   Phase Initial     Distance 1145 feet     Walk Time 6 minutes     # of Rest Breaks 0     MPH 2.17     METS 2.54     RPE 12     Perceived Dyspnea  1     VO2 Peak 8.88     Symptoms No     Resting HR 56 bpm     Resting BP 116/68     Resting Oxygen Saturation  97 %     Exercise Oxygen Saturation  during 6 min walk 96 %     Max Ex. HR 102 bpm     Max Ex. BP 138/70     2 Minute Post BP 132/66              Oxygen Initial Assessment:   Oxygen Re-Evaluation:   Oxygen Discharge (Final Oxygen Re-Evaluation):   Initial Exercise Prescription:  Initial Exercise Prescription - 08/27/23 1500       Date of Initial Exercise RX and Referring Provider   Date 08/27/23    Referring Provider Dr. Eddy Goodell, MD      Oxygen   Maintain Oxygen Saturation 88% or  higher      Treadmill   MPH 2    Grade 0.5    Minutes 15    METs 2.67      Recumbant Bike   Level 2    RPM 50    Watts 14    Minutes 15    METs 2.54      NuStep   Level 2    SPM 80    Minutes 15    METs 2.54      T5 Nustep   Level 1    SPM 80    Minutes 15    METs 2.54      Prescription  Details   Frequency (times per week) 3    Duration Progress to 30 minutes of continuous aerobic without signs/symptoms of physical distress      Intensity   THRR 40-80% of Max Heartrate 90-124    Ratings of Perceived Exertion 11-13    Perceived Dyspnea 0-4      Progression   Progression Continue to progress workloads to maintain intensity without signs/symptoms of physical distress.      Resistance Training   Training Prescription Yes    Weight 4 lb    Reps 10-15             Perform Capillary Blood Glucose checks as needed.  Exercise Prescription Changes:   Exercise Prescription Changes     Row Name 08/27/23 1500 09/04/23 1300 09/21/23 0800 10/02/23 1400       Response to Exercise   Blood Pressure (Admit) 116/68 126/68 128/62 118/58    Blood Pressure (Exercise) 138/70 128/64 144/62 138/60    Blood Pressure (Exit) 132/66 110/60 108/60 122/60    Heart Rate (Admit) 56 bpm 75 bpm 59 bpm 81 bpm    Heart Rate (Exercise) 102 bpm 109 bpm 120 bpm 130 bpm    Heart Rate (Exit) 70 bpm 75 bpm 83 bpm 84 bpm    Oxygen Saturation (Admit) 97 % -- -- --    Oxygen Saturation (Exercise) 96 % -- -- --    Rating of Perceived Exertion (Exercise) 12 13 13 13     Perceived Dyspnea (Exercise) 1 -- -- --    Symptoms none none none none    Comments Results First two days of exercise -- --    Duration -- Continue with 30 min of aerobic exercise without signs/symptoms of physical distress. Continue with 30 min of aerobic exercise without signs/symptoms of physical distress. Continue with 30 min of aerobic exercise without signs/symptoms of physical distress.    Intensity -- THRR  unchanged THRR unchanged THRR unchanged      Progression   Progression -- Continue to progress workloads to maintain intensity without signs/symptoms of physical distress. Continue to progress workloads to maintain intensity without signs/symptoms of physical distress. Continue to progress workloads to maintain intensity without signs/symptoms of physical distress.    Average METs -- 2.9 3.04 3.84      Resistance Training   Training Prescription -- Yes Yes Yes    Weight -- 4 lb 4 lb 7 lb    Reps -- 10-15 10-15 10-15      Interval Training   Interval Training -- No No No      Treadmill   MPH -- 2 3 3.2    Grade -- 0.5 0 3    Minutes -- 15 15 15     METs -- 2.67 3.3 4.77      Recumbant Bike   Level -- 2 6 7     Watts -- 14 22 22     Minutes -- 15 15 15     METs -- 2.52 2.82 2.8      NuStep   Level -- 5 5 5     Minutes -- 15 15 15     METs -- 4.4 4.6 5      T5 Nustep   Level -- 1 5 5     Minutes -- 15 15 15     METs -- 2 2.3 2.5      Biostep-RELP   Level -- -- -- 4    Minutes -- -- -- 15    METs -- -- --  3      Oxygen   Maintain Oxygen Saturation -- 88% or higher 88% or higher 88% or higher             Exercise Comments:   Exercise Comments     Row Name 08/29/23 0935           Exercise Comments First full day of exercise!  Patient was oriented to gym and equipment including functions, settings, policies, and procedures.  Patient's individual exercise prescription and treatment plan were reviewed.  All starting workloads were established based on the results of the 6 minute walk test done at initial orientation visit.  The plan for exercise progression was also introduced and progression will be customized based on patient's performance and goals.                Exercise Goals and Review:   Exercise Goals     Row Name 08/27/23 1409             Exercise Goals   Increase Physical Activity Yes       Intervention Provide advice, education, support and  counseling about physical activity/exercise needs.;Develop an individualized exercise prescription for aerobic and resistive training based on initial evaluation findings, risk stratification, comorbidities and participant's personal goals.       Expected Outcomes Short Term: Attend rehab on a regular basis to increase amount of physical activity.;Long Term: Exercising regularly at least 3-5 days a week.;Long Term: Add in home exercise to make exercise part of routine and to increase amount of physical activity.       Increase Strength and Stamina Yes       Intervention Develop an individualized exercise prescription for aerobic and resistive training based on initial evaluation findings, risk stratification, comorbidities and participant's personal goals.;Provide advice, education, support and counseling about physical activity/exercise needs.       Expected Outcomes Short Term: Increase workloads from initial exercise prescription for resistance, speed, and METs.;Short Term: Perform resistance training exercises routinely during rehab and add in resistance training at home;Long Term: Improve cardiorespiratory fitness, muscular endurance and strength as measured by increased METs and functional capacity ( )       Able to understand and use rate of perceived exertion (RPE) scale Yes       Intervention Provide education and explanation on how to use RPE scale       Expected Outcomes Short Term: Able to use RPE daily in rehab to express subjective intensity level;Long Term:  Able to use RPE to guide intensity level when exercising independently       Able to understand and use Dyspnea scale Yes       Intervention Provide education and explanation on how to use Dyspnea scale       Expected Outcomes Short Term: Able to use Dyspnea scale daily in rehab to express subjective sense of shortness of breath during exertion;Long Term: Able to use Dyspnea scale to guide intensity level when exercising independently        Knowledge and understanding of Target Heart Rate Range (THRR) Yes       Intervention Provide education and explanation of THRR including how the numbers were predicted and where they are located for reference       Expected Outcomes Short Term: Able to state/look up THRR;Short Term: Able to use daily as guideline for intensity in rehab;Long Term: Able to use THRR to govern intensity when exercising independently       Able  to check pulse independently Yes       Intervention Provide education and demonstration on how to check pulse in carotid and radial arteries.;Review the importance of being able to check your own pulse for safety during independent exercise       Expected Outcomes Short Term: Able to explain why pulse checking is important during independent exercise;Long Term: Able to check pulse independently and accurately       Understanding of Exercise Prescription Yes       Intervention Provide education, explanation, and written materials on patient's individual exercise prescription       Expected Outcomes Short Term: Able to explain program exercise prescription;Long Term: Able to explain home exercise prescription to exercise independently                Exercise Goals Re-Evaluation :  Exercise Goals Re-Evaluation     Row Name 08/29/23 [phone] 09/04/23 1348 09/21/23 0859 10/02/23 1406       Exercise Goal Re-Evaluation   Exercise Goals Review Able to understand and use rate of perceived exertion (RPE) scale;Knowledge and understanding of Target Heart Rate Range (THRR);Able to understand and use Dyspnea scale;Understanding of Exercise Prescription Increase Physical Activity;Increase Strength and Stamina;Understanding of Exercise Prescription Increase Physical Activity;Increase Strength and Stamina;Understanding of Exercise Prescription Increase Physical Activity;Increase Strength and Stamina;Understanding of Exercise Prescription    Comments Reviewed RPE and dyspnea scale, THR  and program prescription with pt today.  Pt voiced understanding and was given a copy of goals to take home. Devin Duke is off to a good start in the program. He did well on the treadmill at a speed of 2 mph with an incline of 0.5%. He also improved to level 5 on the T4 nustep and did well working at level 1 on the T5 nustep and level 2 on the recumbent bike. We will continue to monitor his progress in the program. Devin Duke is doing well in rehab. He was recently able to increase from level 1 to 5 on the T5 nustep. He was aso able to increase his speed on the treadmill from to . We will continue to monitor his progress in the program. Devin Duke continues to do well in rehab. He increased his treadmill workload to a speed of 3.2 mph with an incline of 3%. He also improved to level 7 on the recumbent bike and continues to use 7 lb hand weights for resistance training. We will continue to monitor his progress in the program.    Expected Outcomes Short: Use RPE daily to regulate intensity. Long: Follow program prescription in THR. Short: Continue to follow current exercise prescription. Long: Continue exercise to improve strength and stamina. Short: Continue to increase treadmill workload. Long: Continue exercise to improve strength and stamina. Short: Continue to progressively increase treadmill workload. Long: Continue exercise to improve strength and stamina.             Discharge Exercise Prescription (Final Exercise Prescription Changes):  Exercise Prescription Changes - 10/02/23 1400       Response to Exercise   Blood Pressure (Admit) 118/58    Blood Pressure (Exercise) 138/60    Blood Pressure (Exit) 122/60    Heart Rate (Admit) 81 bpm    Heart Rate (Exercise) 130 bpm    Heart Rate (Exit) 84 bpm    Rating of Perceived Exertion (Exercise) 13    Symptoms none    Duration Continue with 30 min of aerobic exercise without signs/symptoms of physical distress.  Intensity THRR unchanged       Progression   Progression Continue to progress workloads to maintain intensity without signs/symptoms of physical distress.    Average METs 3.84      Resistance Training   Training Prescription Yes    Weight 7 lb    Reps 10-15      Interval Training   Interval Training No      Treadmill   MPH 3.2    Grade 3    Minutes 15    METs 4.77      Recumbant Bike   Level 7    Watts 22    Minutes 15    METs 2.8      NuStep   Level 5    Minutes 15    METs 5      T5 Nustep   Level 5    Minutes 15    METs 2.5      Biostep-RELP   Level 4    Minutes 15    METs 3      Oxygen   Maintain Oxygen Saturation 88% or higher             Nutrition:  Target Goals: Understanding of nutrition guidelines, daily intake of sodium 1500mg , cholesterol 200mg , calories 30% from fat and 7% or less from saturated fats, daily to have 5 or more servings of fruits and vegetables.  Education: All About Nutrition: -Group instruction provided by verbal, written material, interactive activities, discussions, models, and posters to present general guidelines for heart healthy nutrition including fat, fiber, MyPlate, the role of sodium in heart healthy nutrition, utilization of the nutrition label, and utilization of this knowledge for meal planning. Follow up email sent as well. Written material given at graduation. Flowsheet Row Cardiac Rehab from 10/03/2023 in [facility] Cardiac and Pulmonary Rehab  Date 08/29/23  Educator JG part 2  Instruction Review Code 1- Verbalizes Understanding       Biometrics:  Pre Biometrics - 08/27/23 1551       Pre Biometrics   Height 6' 1.5" (1.867 m)    Weight 186 lb 4.8 oz (84.5 kg)    Waist Circumference 33 inches    Hip Circumference 39.5 inches    Waist to Hip Ratio 0.84 %    BMI (Calculated) 24.24    Single Leg Stand 21.2 seconds              Nutrition Therapy Plan and Nutrition Goals:  Nutrition Therapy & Goals - 09/24/23 1426       Nutrition  Therapy   Diet Cardiac, Low Na    Protein (specify units) 80    Fiber 30 grams    Whole Grain Foods 3 servings    Saturated Fats 15 max. grams    Fruits and Vegetables 5 servings/day    Sodium 2 grams      Personal Nutrition Goals   Nutrition Goal Include protein at meals    Personal Goal #2 Drink 48-64oz of water    Personal Goal #3 Include a serving of fruit each day    Comments Patient drinking mostly water, but not much of it. Says he has been working on drinking more, currently ~32oz per day. Set goal to drink 48-64oz daily. His wife is nutrition conscious, saying that she keeps him following a heart healthy diet. He doesn't read labels, but tries not to eat much processed foods, instead his wife provides him lots of fruits, veggies, and whole grains. He  says he could do better with eating fruit. Reviewed mediterranean diet handout. Educated on types of fats, sources, and how to read labels. Provided balanced plates handout, recommended he eat more protein than his recall suggested. Brainstormed ways to include more protein at meals and snacks.      Intervention Plan   Intervention Prescribe, educate and counsel regarding individualized specific dietary modifications aiming towards targeted core components such as weight, hypertension, lipid management, diabetes, heart failure and other comorbidities.;Nutrition handout(s) given to patient.    Expected Outcomes Short Term Goal: Understand basic principles of dietary content, such as calories, fat, sodium, cholesterol and nutrients.;Short Term Goal: A plan has been developed with personal nutrition goals set during dietitian appointment.;Long Term Goal: Adherence to prescribed nutrition plan.             Nutrition Assessments:  MEDIFICTS Score Key: >=70 Need to make dietary changes  40-70 Heart Healthy Diet <= 40 Therapeutic Level Cholesterol Diet  Flowsheet Row Cardiac Rehab from 08/29/2023 in [facility] Cardiac and Pulmonary Rehab   Picture Your Plate Total Score on Admission 72      Picture Your Plate Scores: <69 Unhealthy dietary pattern with much room for improvement. 41-50 Dietary pattern unlikely to meet recommendations for good health and room for improvement. 51-60 More healthful dietary pattern, with some room for improvement.  >60 Healthy dietary pattern, although there may be some specific behaviors that could be improved.    Nutrition Goals Re-Evaluation:  Nutrition Goals Re-Evaluation     Row Name 09/17/23 0946             Goals   Comment meet with RD to establish goals. Appointment scheduled for 09/24/23       Expected Outcome Short: meet with RD to establish nutrition goals. Long: maintain heart healthy diet.                Nutrition Goals Discharge (Final Nutrition Goals Re-Evaluation):  Nutrition Goals Re-Evaluation - 09/17/23 0946       Goals   Comment meet with RD to establish goals. Appointment scheduled for 09/24/23    Expected Outcome Short: meet with RD to establish nutrition goals. Long: maintain heart healthy diet.             Psychosocial: Target Goals: Acknowledge presence or absence of significant depression and/or stress, maximize coping skills, provide positive support system. Participant is able to verbalize types and ability to use techniques and skills needed for reducing stress and depression.   Education: Stress, Anxiety, and Depression - Group verbal and visual presentation to define topics covered.  Reviews how body is impacted by stress, anxiety, and depression.  Also discusses healthy ways to reduce stress and to treat/manage anxiety and depression.  Written material given at graduation. Flowsheet Row Cardiac Rehab from 10/03/2023 in [facility] Cardiac and Pulmonary Rehab  Date 10/03/23  Educator [facility]  Instruction Review Code 1- [employer] Understanding       Education: Sleep Hygiene -Provides group verbal and written instruction about how sleep can affect  your health.  Define sleep hygiene, discuss sleep cycles and impact of sleep habits. Review good sleep hygiene tips.    Initial Review & Psychosocial Screening:  Initial Psych Review & Screening - 08/20/23 1018       Initial Review   Current issues with None Identified      Family Dynamics   Good Support System? Yes    Comments Taiga can look to his wife and son for support.  He reports no mental instabilities and does not take anything for his mood.      Barriers   Psychosocial barriers to participate in program The patient should benefit from training in stress management and relaxation.;There are no identifiable barriers or psychosocial needs.      Screening Interventions   Interventions Encouraged to exercise;To provide support and resources with identified psychosocial needs;Provide feedback about the scores to participant;Program counselor consult    Expected Outcomes Short Term goal: Utilizing psychosocial counselor, staff and physician to assist with identification of specific Stressors or current issues interfering with healing process. Setting desired goal for each stressor or current issue identified.;Long Term Goal: Stressors or current issues are controlled or eliminated.;Short Term goal: Identification and review with participant of any Quality of Life or Depression concerns found by scoring the questionnaire.;Long Term goal: The participant improves quality of Life and PHQ9 Scores as seen by post scores and/or verbalization of changes             Quality of Life Scores:   Quality of Life - 08/29/23 0945       Quality of Life   Select Quality of Life      Quality of Life Scores   Health/Function Pre 28.71 %    Socioeconomic Pre 30 %    Psych/Spiritual Pre 28.29 %    Family Pre 30 %    GLOBAL Pre 29.06 %            Scores of 19 and below usually indicate a poorer quality of life in these areas.  A difference of  2-3 points is a clinically meaningful  difference.  A difference of 2-3 points in the total score of the Quality of Life Index has been associated with significant improvement in overall quality of life, self-image, physical symptoms, and general health in studies assessing change in quality of life.  PHQ-9: Review Flowsheet       08/27/2023  Depression screen PHQ 2/9  Decreased Interest 1  Down, Depressed, Hopeless 0  PHQ - 2 Score 1  Altered sleeping 0  Tired, decreased energy 1  Change in appetite 0  Feeling bad or failure about yourself  0  Trouble concentrating 0  Moving slowly or fidgety/restless 0  Suicidal thoughts 0  PHQ-9 Score 2  Difficult doing work/chores Not difficult at all   Interpretation of Total Score  Total Score Depression Severity:  1-4 = Minimal depression, 5-9 = Mild depression, 10-14 = Moderate depression, 15-19 = Moderately severe depression, 20-27 = Severe depression   Psychosocial Evaluation and Intervention:  Psychosocial Evaluation - 08/20/23 1019       Psychosocial Evaluation & Interventions   Interventions Encouraged to exercise with the program and follow exercise prescription;Relaxation education;Stress management education    Comments Aadyn can look to his wife and son for support. He reports no mental instabilities and does not take anything for his mood.    Expected Outcomes Short: Start [facility] to help with mood. Long: Maintain a healthy mental state    Continue Psychosocial Services  Follow up required by staff             Psychosocial Re-Evaluation:  Psychosocial Re-Evaluation     Row Name 09/17/23 [phone]             Psychosocial Re-Evaluation   Current issues with None Identified       Comments Devin Duke reports that he continues to have a positive attitdue. He reports that he has  a good support system. He is looking forward to gaining more strength and getting though the healing process so that he can get back to playing golf.       Expected Outcomes Short: get  back to playing golf. Long: maintian good mental health routine.       Interventions Encouraged to attend Cardiac Rehabilitation for the exercise       Continue Psychosocial Services  Follow up required by staff                Psychosocial Discharge (Final Psychosocial Re-Evaluation):  Psychosocial Re-Evaluation - 09/17/23 0953       Psychosocial Re-Evaluation   Current issues with None Identified    Comments Devin Duke reports that he continues to have a positive attitdue. He reports that he has a good support system. He is looking forward to gaining more strength and getting though the healing process so that he can get back to playing golf.    Expected Outcomes Short: get back to playing golf. Long: maintian good mental health routine.    Interventions Encouraged to attend Cardiac Rehabilitation for the exercise    Continue Psychosocial Services  Follow up required by staff             Vocational Rehabilitation: Provide vocational rehab assistance to qualifying candidates.   Vocational Rehab Evaluation & Intervention:   Education: Education Goals: Education classes will be provided on a variety of topics geared toward better understanding of heart health and risk factor modification. Participant will state understanding/return demonstration of topics presented as noted by education test scores.  Learning Barriers/Preferences:  Learning Barriers/Preferences - 08/20/23 1018       Learning Barriers/Preferences   Learning Barriers None    Learning Preferences None             General Cardiac Education Topics:  AED/CPR: - Group verbal and written instruction with the use of models to demonstrate the basic use of the AED with the basic ABC's of resuscitation.   Anatomy and Cardiac Procedures: - Group verbal and visual presentation and models provide information about basic cardiac anatomy and function. Reviews the testing methods done to diagnose heart disease and the  outcomes of the test results. Describes the treatment choices: Medical Management, Angioplasty, or Coronary Bypass Surgery for treating various heart conditions including Myocardial Infarction, Angina, Valve Disease, and Cardiac Arrhythmias.  Written material given at graduation. Flowsheet Row Cardiac Rehab from 10/03/2023 in [facility] Cardiac and Pulmonary Rehab  Date 09/05/23  Educator sb  Instruction Review Code 1- Verbalizes Understanding       Medication Safety: - Group verbal and visual instruction to review commonly prescribed medications for heart and lung disease. Reviews the medication, class of the drug, and side effects. Includes the steps to properly store meds and maintain the prescription regimen.  Written material given at graduation. Flowsheet Row Cardiac Rehab from 10/03/2023 in [facility] Cardiac and Pulmonary Rehab  Date 09/12/23  Educator [facility]  Instruction Review Code 1- Verbalizes Understanding       Intimacy: - Group verbal instruction through game format to discuss how heart and lung disease can affect sexual intimacy. Written material given at graduation..   Know Your Numbers and Heart Failure: - Group verbal and visual instruction to discuss disease risk factors for cardiac and pulmonary disease and treatment options.  Reviews associated critical values for Overweight/Obesity, Hypertension, Cholesterol, and Diabetes.  Discusses basics of heart failure: signs/symptoms and treatments.  Introduces Heart Failure Zone chart  for action plan for heart failure.  Written material given at graduation. Flowsheet Row Cardiac Rehab from 10/03/2023 in [facility] Cardiac and Pulmonary Rehab  Date 09/19/23  Educator [facility]  Instruction Review Code 1- Verbalizes Understanding       Infection Prevention: - Provides verbal and written material to individual with discussion of infection control including proper hand washing and proper equipment cleaning during exercise session. Flowsheet Row Cardiac  Rehab from 10/03/2023 in [facility] Cardiac and Pulmonary Rehab  Date 08/20/23  Educator [facility]  Instruction Review Code 1- Verbalizes Understanding       Falls Prevention: - Provides verbal and written material to individual with discussion of falls prevention and safety. Flowsheet Row Cardiac Rehab from 10/03/2023 in [facility] Cardiac and Pulmonary Rehab  Date 08/20/23  Educator [facility]  Instruction Review Code 1- Verbalizes Understanding       Other: -Provides group and verbal instruction on various topics (see comments)   Knowledge Questionnaire Score:  Knowledge Questionnaire Score - 08/29/23 0948       Knowledge Questionnaire Score   Pre Score 24/26             Core Components/Risk Factors/Patient Goals at Admission:  Personal Goals and Risk Factors at Admission - 08/20/23 1017       Core Components/Risk Factors/Patient Goals on Admission    Weight Management Yes;Weight Gain    Intervention Weight Management: Develop a combined nutrition and exercise program designed to reach desired caloric intake, while maintaining appropriate intake of nutrient and fiber, sodium and fats, and appropriate energy expenditure required for the weight goal.;Weight Management: Provide education and appropriate resources to help participant work on and attain dietary goals.;Weight Management/Obesity: Establish reasonable short term and long term weight goals.    Expected Outcomes Short Term: Continue to assess and modify interventions until short term weight is achieved;Weight Maintenance: Understanding of the daily nutrition guidelines, which includes 25-35% calories from fat, 7% or less cal from saturated fats, less than 200mg  cholesterol, less than 1.5gm of sodium, & 5 or more servings of fruits and vegetables daily;Understanding recommendations for meals to include 15-35% energy as protein, 25-35% energy from fat, 35-60% energy from carbohydrates, less than 200mg  of dietary cholesterol, 20-35 gm of total  fiber daily;Understanding of distribution of calorie intake throughout the day with the consumption of 4-5 meals/snacks    Hypertension Yes    Intervention Provide education on lifestyle modifcations including regular physical activity/exercise, weight management, moderate sodium restriction and increased consumption of fresh fruit, vegetables, and low fat dairy, alcohol moderation, and smoking cessation.;Monitor prescription use compliance.    Expected Outcomes Short Term: Continued assessment and intervention until BP is < 140/3mm HG in hypertensive participants. < 130/23mm HG in hypertensive participants with diabetes, heart failure or chronic kidney disease.;Long Term: Maintenance of blood pressure at goal levels.    Lipids Yes    Intervention Provide education and support for participant on nutrition & aerobic/resistive exercise along with prescribed medications to achieve LDL 70mg , HDL >40mg .    Expected Outcomes Short Term: Participant states understanding of desired cholesterol values and is compliant with medications prescribed. Participant is following exercise prescription and nutrition guidelines.;Long Term: Cholesterol controlled with medications as prescribed, with individualized exercise RX and with personalized nutrition plan. Value goals: LDL < 70mg , HDL > 40 mg.             Education:Diabetes - Individual verbal and written instruction to review signs/symptoms of diabetes, desired ranges of glucose level fasting, after meals and  with exercise. Acknowledge that pre and post exercise glucose checks will be done for 3 sessions at entry of program.   Core Components/Risk Factors/Patient Goals Review:   Goals and Risk Factor Review     Row Name 09/17/23 0941             Core Components/Risk Factors/Patient Goals Review   Personal Goals Review Hypertension;Lipids       Review Patient reports that he takes all BP and cholesterol meds and follows up with his doctor regulary.  He is currently seeing his PCP as follow up and is getting established with a cardiologist in May. He checks is BP at home every day.       Expected Outcomes Short: get established with cardiologist. Long: continue to monitor and control cardiac risk factors.                Core Components/Risk Factors/Patient Goals at Discharge (Final Review):   Goals and Risk Factor Review - 09/17/23 0941       Core Components/Risk Factors/Patient Goals Review   Personal Goals Review Hypertension;Lipids    Review Patient reports that he takes all BP and cholesterol meds and follows up with his doctor regulary. He is currently seeing his PCP as follow up and is getting established with a cardiologist in May. He checks is BP at home every day.    Expected Outcomes Short: get established with cardiologist. Long: continue to monitor and control cardiac risk factors.             ITP Comments:  ITP Comments     Row Name 08/20/23 1017 08/27/23 1407 08/29/23 0935 09/05/23 1126 10/03/23 1303   ITP Comments Virtual Visit completed. Patient informed on EP and RD appointment and 6 Minute walk test. Patient also informed of patient health questionnaires on My Chart. Patient Verbalizes understanding. Visit diagnosis can be found in [facility] 08/01/2023. Completed and gym orientation. Initial ITP created and sent for review to Dr. Firman Hughes, Medical Director. First full day of exercise!  Patient was oriented to gym and equipment including functions, settings, policies, and procedures.  Patient's individual exercise prescription and treatment plan were reviewed.  All starting workloads were established based on the results of the 6 minute walk test done at initial orientation visit.  The plan for exercise progression was also introduced and progression will be customized based on patient's performance and goals. 30 Day review completed. Medical Director ITP review done, changes made as directed, and signed approval by  Medical Director.    new to program 30 Day review completed. Medical Director ITP review done, changes made as directed, and signed approval by Medical Director.            Comments: 30 day review

## 2023-10-05 ENCOUNTER — Encounter: Admitting: *Deleted

## 2023-10-05 DIAGNOSIS — Z951 Presence of aortocoronary bypass graft: Secondary | ICD-10-CM

## 2023-10-05 NOTE — Progress Notes (Signed)
 Daily Session Note  Patient Details  Name: NAFIS GRESS MRN: [id number] Date of Birth: 06-Oct-1945 Referring Provider:   Flowsheet Row Cardiac Rehab from 08/27/2023 in [facility] Cardiac and Pulmonary Rehab  Referring Provider Dr. Eddy Goodell, MD       Encounter Date: 10/05/2023  Check In:  Session Check In - 10/05/23 0945       Check-In   Supervising physician immediately available to respond to emergencies See telemetry face sheet for immediately available ER MD    Location [facility]-Cardiac & Pulmonary Rehab    Staff Present Maud Sorenson, RN, BSN, CCRP;Meredith Manson Seitz RN,BSN;Maxon Westchase BS, Exercise Physiologist;Jason Martina Sledge RDN,LDN    Virtual Visit No    Medication changes reported     No    Fall or balance concerns reported    No    Warm-up and Cool-down Performed on first and last piece of equipment    Resistance Training Performed Yes    VAD Patient? No    PAD/SET Patient? No      Pain Assessment   Currently in Pain? No/denies                Social History   Tobacco Use  Smoking Status Former   Current packs/day: 0.00   Types: Cigarettes   Quit date: 09/07/1980   Years since quitting: 43.1  Smokeless Tobacco Not on file    Goals Met:  Independence with exercise equipment Exercise tolerated well No report of concerns or symptoms today  Goals Unmet:  Not Applicable  Comments: Pt able to follow exercise prescription today without complaint.  Will continue to monitor for progression.    Dr. Firman Hughes is Medical Director for [facility] Cardiac Rehabilitation.  Dr. Fuad Aleskerov is Medical Director for [facility] Pulmonary Rehabilitation.

## 2023-10-08 ENCOUNTER — Encounter: Admitting: *Deleted

## 2023-10-08 DIAGNOSIS — Z951 Presence of aortocoronary bypass graft: Secondary | ICD-10-CM | POA: Diagnosis not present

## 2023-10-08 NOTE — Progress Notes (Signed)
 Daily Session Note  Patient Details  Name: LEVAR KNIERIM MRN: [id number] Date of Birth: Jun 21, 1945 Referring Provider:   Flowsheet Row Cardiac Rehab from 08/27/2023 in [facility] Cardiac and Pulmonary Rehab  Referring Provider Dr. Eddy Goodell, MD       Encounter Date: 10/08/2023  Check In:  Session Check In - 10/08/23 0939       Check-In   Supervising physician immediately available to respond to emergencies See telemetry face sheet for immediately available ER MD    Location [facility]-Cardiac & Pulmonary Rehab    Staff Present Maud Sorenson, RN, BSN, CCRP;Joseph Hood RCP,RRT,BSRT;Kelly [place] BS, ACSM CEP, Exercise Physiologist;Maxon Conetta BS, Exercise Physiologist    Virtual Visit No    Medication changes reported     No    Fall or balance concerns reported    No    Warm-up and Cool-down Performed on first and last piece of equipment    Resistance Training Performed Yes    VAD Patient? No    PAD/SET Patient? No      Pain Assessment   Currently in Pain? No/denies                Social History   Tobacco Use  Smoking Status Former   Current packs/day: 0.00   Types: Cigarettes   Quit date: 09/07/1980   Years since quitting: 43.1  Smokeless Tobacco Not on file    Goals Met:  Independence with exercise equipment Exercise tolerated well No report of concerns or symptoms today  Goals Unmet:  Not Applicable  Comments: Pt able to follow exercise prescription today without complaint.  Will continue to monitor for progression.   Reviewed home exercise with pt today.  Pt plans to walk and use the [facility] fitness center for exercise.  Reviewed THR, pulse, RPE, sign and symptoms, pulse oximetery and when to call 911 or MD.  Also discussed weather considerations and indoor options.  Pt voiced understanding.   Dr. Firman Hughes is Medical Director for [facility] Cardiac Rehabilitation.  Dr. Fuad Aleskerov is Medical Director for [facility] Pulmonary Rehabilitation.

## 2023-10-10 ENCOUNTER — Encounter: Admitting: *Deleted

## 2023-10-10 DIAGNOSIS — Z951 Presence of aortocoronary bypass graft: Secondary | ICD-10-CM | POA: Diagnosis not present

## 2023-10-10 NOTE — Progress Notes (Signed)
 Daily Session Note  Patient Details  Name: Devin Duke MRN: [id number] Date of Birth: April 10, 1946 Referring Provider:   Flowsheet Row Cardiac Rehab from 08/27/2023 in [facility] Cardiac and Pulmonary Rehab  Referring Provider Dr. Eddy Goodell, MD       Encounter Date: 10/10/2023  Check In:  Session Check In - 10/10/23 0936       Check-In   Supervising physician immediately available to respond to emergencies See telemetry face sheet for immediately available ER MD    Location [facility]-Cardiac & Pulmonary Rehab    Staff Present Maud Sorenson, RN, BSN, CCRP;Joseph Hood RCP,RRT,BSRT;Maxon Arnold Line BS, Exercise Physiologist;Noah Tickle, BS, Exercise Physiologist    Virtual Visit No    Medication changes reported     No    Fall or balance concerns reported    No    Warm-up and Cool-down Performed on first and last piece of equipment    Resistance Training Performed Yes    VAD Patient? No    PAD/SET Patient? No      Pain Assessment   Currently in Pain? No/denies                Social History   Tobacco Use  Smoking Status Former   Current packs/day: 0.00   Types: Cigarettes   Quit date: 09/07/1980   Years since quitting: 43.1  Smokeless Tobacco Not on file    Goals Met:  Independence with exercise equipment Exercise tolerated well No report of concerns or symptoms today  Goals Unmet:  Not Applicable  Comments: Pt able to follow exercise prescription today without complaint.  Will continue to monitor for progression.    Dr. Firman Hughes is Medical Director for [facility] Cardiac Rehabilitation.  Dr. Fuad Aleskerov is Medical Director for [facility] Pulmonary Rehabilitation.

## 2023-10-12 ENCOUNTER — Encounter: Admitting: *Deleted

## 2023-10-12 DIAGNOSIS — Z951 Presence of aortocoronary bypass graft: Secondary | ICD-10-CM

## 2023-10-12 NOTE — Progress Notes (Signed)
 Daily Session Note  Patient Details  Name: Devin Duke MRN: [id number] Date of Birth: Oct 12, 1945 Referring Provider:   Flowsheet Row Cardiac Rehab from 08/27/2023 in [facility] Cardiac and Pulmonary Rehab  Referring Provider Dr. Eddy Goodell, MD       Encounter Date: 10/12/2023  Check In:  Session Check In - 10/12/23 0936       Check-In   Supervising physician immediately available to respond to emergencies See telemetry face sheet for immediately available ER MD    Location [facility]-Cardiac & Pulmonary Rehab    Staff Present Maud Sorenson, RN, BSN, CCRP;Joseph Hood RCP,RRT,BSRT;Maxon [place] BS, Exercise Physiologist;Noah Tickle, BS, Exercise Physiologist    Virtual Visit No    Medication changes reported     No    Fall or balance concerns reported    No    Warm-up and Cool-down Performed on first and last piece of equipment    Resistance Training Performed Yes    VAD Patient? No    PAD/SET Patient? No      Pain Assessment   Currently in Pain? No/denies                Social History   Tobacco Use  Smoking Status Former   Current packs/day: 0.00   Types: Cigarettes   Quit date: 09/07/1980   Years since quitting: 43.1  Smokeless Tobacco Not on file    Goals Met:  Independence with exercise equipment Exercise tolerated well No report of concerns or symptoms today  Goals Unmet:  Not Applicable  Comments: Pt able to follow exercise prescription today without complaint.  Will continue to monitor for progression.    Dr. Firman Hughes is Medical Director for [facility] Cardiac Rehabilitation.  Dr. Fuad Aleskerov is Medical Director for [facility] Pulmonary Rehabilitation.

## 2023-10-15 ENCOUNTER — Encounter: Admitting: *Deleted

## 2023-10-15 DIAGNOSIS — Z951 Presence of aortocoronary bypass graft: Secondary | ICD-10-CM | POA: Diagnosis not present

## 2023-10-15 NOTE — Progress Notes (Signed)
 Daily Session Note  Patient Details  Name: Devin Duke MRN: [id number] Date of Birth: 1945/08/18 Referring Provider:   Flowsheet Row Cardiac Rehab from 08/27/2023 in [facility] Cardiac and Pulmonary Rehab  Referring Provider Dr. Eddy Goodell, MD       Encounter Date: 10/15/2023  Check In:  Session Check In - 10/15/23 0923       Check-In   Supervising physician immediately available to respond to emergencies See telemetry face sheet for immediately available ER MD    Location [facility]-Cardiac & Pulmonary Rehab    Staff Present Maud Sorenson, RN, BSN, CCRP;Kelly Hayes BS, ACSM CEP, Exercise Physiologist;Maxon Conetta BS, Exercise Physiologist;Jason Martina Sledge RDN,LDN    Virtual Visit No    Medication changes reported     No    Fall or balance concerns reported    No    Warm-up and Cool-down Performed on first and last piece of equipment    Resistance Training Performed Yes    VAD Patient? No    PAD/SET Patient? No      Pain Assessment   Currently in Pain? No/denies                Social History   Tobacco Use  Smoking Status Former   Current packs/day: 0.00   Types: Cigarettes   Quit date: 09/07/1980   Years since quitting: 43.1  Smokeless Tobacco Not on file    Goals Met:  Independence with exercise equipment Exercise tolerated well No report of concerns or symptoms today  Goals Unmet:  Not Applicable  Comments: .Pt able to follow exercise prescription today without complaint.  Will continue to monitor for progression.    Dr. Firman Hughes is Medical Director for [facility] Cardiac Rehabilitation.  Dr. Fuad Aleskerov is Medical Director for [facility] Pulmonary Rehabilitation.

## 2023-10-17 ENCOUNTER — Encounter: Admitting: *Deleted

## 2023-10-17 VITALS — Ht 73.5 in | Wt 184.6 lb

## 2023-10-17 DIAGNOSIS — Z951 Presence of aortocoronary bypass graft: Secondary | ICD-10-CM

## 2023-10-17 NOTE — Progress Notes (Signed)
 Daily Session Note  Patient Details  Name: EIDAN MUELLNER MRN: [id number] Date of Birth: [birth year] Referring Provider:   Flowsheet Row Cardiac Rehab from 08/27/2023 in [facility] Cardiac and Pulmonary Rehab  Referring Provider Dr. Eddy Goodell, MD       Encounter Date: 10/17/2023  Check In:  Session Check In - 10/17/23 1451       Check-In   Supervising physician immediately available to respond to emergencies See telemetry face sheet for immediately available ER MD    Location [facility]-Cardiac & Pulmonary Rehab    Staff Present Maud Sorenson, RN, BSN, CCRP;Maxon Conetta BS, Exercise Physiologist;Noah Tickle, BS, Exercise Physiologist;Jason Martina Sledge RDN,LDN    Virtual Visit No    Medication changes reported     No    Fall or balance concerns reported    No    Warm-up and Cool-down Performed on first and last piece of equipment    Resistance Training Performed Yes    VAD Patient? No    PAD/SET Patient? No      Pain Assessment   Currently in Pain? No/denies                Social History   Tobacco Use  Smoking Status Former   Current packs/day: 0.00   Types: Cigarettes   Quit date: 09/07/1980   Years since quitting: 43.1  Smokeless Tobacco Not on file    Goals Met:  Independence with exercise equipment Exercise tolerated well No report of concerns or symptoms today  Goals Unmet:  Not Applicable  Comments: Pt able to follow exercise prescription today without complaint.  Will continue to monitor for progression.    Dr. Firman Hughes is Medical Director for [facility] Cardiac Rehabilitation.  Dr. Fuad Aleskerov is Medical Director for [facility] Pulmonary Rehabilitation.

## 2023-10-17 NOTE — Patient Instructions (Signed)
 Discharge Patient Instructions  Patient Details  Name: Devin Duke MRN: [id number] Date of Birth: [birth year] Referring Provider:  Melchor Spoon, MD   Number of Visits: 36  Reason for Discharge:  Patient reached a stable level of exercise. Patient independent in their exercise. Patient has met program and personal goals.  Diagnosis:  S/P CABG x 3  Initial Exercise Prescription:  Initial Exercise Prescription - 08/27/23 1500       Date of Initial Exercise RX and Referring Provider   Date 08/27/23    Referring Provider Dr. Eddy Goodell, MD      Oxygen   Maintain Oxygen Saturation 88% or higher      Treadmill   MPH 2    Grade 0.5    Minutes 15    METs 2.67      Recumbant Bike   Level 2    RPM 50    Watts 14    Minutes 15    METs 2.54      NuStep   Level 2    SPM 80    Minutes 15    METs 2.54      T5 Nustep   Level 1    SPM 80    Minutes 15    METs 2.54      Prescription Details   Frequency (times per week) 3    Duration Progress to 30 minutes of continuous aerobic without signs/symptoms of physical distress      Intensity   THRR 40-80% of Max Heartrate 90-124    Ratings of Perceived Exertion 11-13    Perceived Dyspnea 0-4      Progression   Progression Continue to progress workloads to maintain intensity without signs/symptoms of physical distress.      Resistance Training   Training Prescription Yes    Weight 4 lb    Reps 10-15             Discharge Exercise Prescription (Final Exercise Prescription Changes):  Exercise Prescription Changes - 10/08/23 0900       Response to Exercise   Duration Continue with 30 min of aerobic exercise without signs/symptoms of physical distress.    Intensity THRR unchanged      Progression   Progression Continue to progress workloads to maintain intensity without signs/symptoms of physical distress.    Average METs 3.84      Resistance Training   Training Prescription Yes    Weight 7 lb     Reps 10-15      Interval Training   Interval Training No      Treadmill   MPH 3.2    Grade 3    Minutes 15    METs 4.77      Recumbant Bike   Level 7    Watts 22    Minutes 15    METs 2.8      NuStep   Level 5    Minutes 15    METs 5      T5 Nustep   Level 5    Minutes 15    METs 2.5      Biostep-RELP   Level 4    Minutes 15    METs 3      Home Exercise Plan   Plans to continue exercise at Home (comment)   walk and use [facility]   Frequency Add 2 additional days to program exercise sessions.    Initial Home Exercises Provided 10/08/23  Oxygen   Maintain Oxygen Saturation 88% or higher             Functional Capacity:  6 Minute Walk     Row Name 08/27/23 1550 10/17/23 0932       6 Minute Walk   Phase Initial Discharge    Distance 1145 feet 1450 feet    Distance % Change -- 26.6 %    Distance Feet Change -- 305 ft    Walk Time 6 minutes 6 minutes    # of Rest Breaks 0 0    MPH 2.17 2.75    METS 2.54 3.23    RPE 12 11    Perceived Dyspnea  1 0    VO2 Peak 8.88 11.31    Symptoms No No    Resting HR 56 bpm 72 bpm    Resting BP 116/68 124/66    Resting Oxygen Saturation  97 % 95 %    Exercise Oxygen Saturation  during 6 min walk 96 % 97 %    Max Ex. HR 102 bpm 115 bpm    Max Ex. BP 138/70 140/68    2 Minute Post BP 132/66 --            Nutrition & Weight - Outcomes:  Pre Biometrics - 08/27/23 1551       Pre Biometrics   Height 6' 1.5" (1.867 m)    Weight 186 lb 4.8 oz (84.5 kg)    Waist Circumference 33 inches    Hip Circumference 39.5 inches    Waist to Hip Ratio 0.84 %    BMI (Calculated) 24.24    Single Leg Stand 21.2 seconds             Post Biometrics - 10/17/23 0935        Post  Biometrics   Height 6' 1.5" (1.867 m)    Weight 184 lb 9.6 oz (83.7 kg)    Waist Circumference 33 inches    Hip Circumference 41 inches    Waist to Hip Ratio 0.8 %    BMI (Calculated) 24.02    Single Leg Stand 10.4  seconds             Nutrition:  Nutrition Therapy & Goals - 09/24/23 1426       Nutrition Therapy   Diet Cardiac, Low Na    Protein (specify units) 80    Fiber 30 grams    Whole Grain Foods 3 servings    Saturated Fats 15 max. grams    Fruits and Vegetables 5 servings/day    Sodium 2 grams      Personal Nutrition Goals   Nutrition Goal Include protein at meals    Personal Goal #2 Drink 48-64oz of water    Personal Goal #3 Include a serving of fruit each day    Comments Patient drinking mostly water, but not much of it. Says he has been working on drinking more, currently ~32oz per day. Set goal to drink 48-64oz daily. His wife is nutrition conscious, saying that she keeps him following a heart healthy diet. He doesn't read labels, but tries not to eat much processed foods, instead his wife provides him lots of fruits, veggies, and whole grains. He says he could do better with eating fruit. Reviewed mediterranean diet handout. Educated on types of fats, sources, and how to read labels. Provided balanced plates handout, recommended he eat more protein than his recall suggested. Brainstormed ways to include more protein at  meals and snacks.      Intervention Plan   Intervention Prescribe, educate and counsel regarding individualized specific dietary modifications aiming towards targeted core components such as weight, hypertension, lipid management, diabetes, heart failure and other comorbidities.;Nutrition handout(s) given to patient.    Expected Outcomes Short Term Goal: Understand basic principles of dietary content, such as calories, fat, sodium, cholesterol and nutrients.;Short Term Goal: A plan has been developed with personal nutrition goals set during dietitian appointment.;Long Term Goal: Adherence to prescribed nutrition plan.

## 2023-10-19 ENCOUNTER — Encounter: Admitting: *Deleted

## 2023-10-19 DIAGNOSIS — Z951 Presence of aortocoronary bypass graft: Secondary | ICD-10-CM

## 2023-10-19 NOTE — Progress Notes (Signed)
 Daily Session Note  Patient Details  Name: ORAN DILLENBURG MRN: [id number] Date of Birth: Nov 25, 1945 Referring Provider:   Flowsheet Row Cardiac Rehab from 08/27/2023 in [facility] Cardiac and Pulmonary Rehab  Referring Provider Dr. Eddy Goodell, MD       Encounter Date: 10/19/2023  Check In:  Session Check In - 10/19/23 0940       Check-In   Supervising physician immediately available to respond to emergencies See telemetry face sheet for immediately available ER MD    Location [facility]-Cardiac & Pulmonary Rehab    Staff Present Maud Sorenson, RN, BSN, CCRP;Joseph Hood RCP,RRT,BSRT;Maxon [place] BS, Exercise Physiologist;Noah Tickle, BS, Exercise Physiologist    Virtual Visit No    Medication changes reported     No    Fall or balance concerns reported    No    Warm-up and Cool-down Performed on first and last piece of equipment    Resistance Training Performed Yes    VAD Patient? No    PAD/SET Patient? No      Pain Assessment   Currently in Pain? No/denies                Social History   Tobacco Use  Smoking Status Former   Current packs/day: 0.00   Types: Cigarettes   Quit date: 09/07/1980   Years since quitting: 43.1  Smokeless Tobacco Not on file    Goals Met:  Proper associated with RPD/PD & O2 Sat Exercise tolerated well No report of concerns or symptoms today  Goals Unmet:  Not Applicable  Comments: Pt able to follow exercise prescription today without complaint.  Will continue to monitor for progression.    Dr. Firman Hughes is Medical Director for [facility] Cardiac Rehabilitation.  Dr. Fuad Aleskerov is Medical Director for [facility] Pulmonary Rehabilitation.

## 2023-10-24 ENCOUNTER — Encounter: Admitting: *Deleted

## 2023-10-24 DIAGNOSIS — Z951 Presence of aortocoronary bypass graft: Secondary | ICD-10-CM | POA: Diagnosis not present

## 2023-10-24 NOTE — Progress Notes (Signed)
 Daily Session Note  Patient Details  Name: Devin Duke MRN: [id number] Date of Birth: Sep 23, 1945 Referring Provider:   Flowsheet Row Cardiac Rehab from 08/27/2023 in [facility] Cardiac and Pulmonary Rehab  Referring Provider Dr. Eddy Goodell, MD       Encounter Date: 10/24/2023  Check In:  Session Check In - 10/24/23 0943       Check-In   Supervising physician immediately available to respond to emergencies See telemetry face sheet for immediately available ER MD    Location [facility]-Cardiac & Pulmonary Rehab    Staff Present Maud Sorenson, RN, BSN, CCRP;Maxon [place] BS, Exercise Physiologist;Noah Tickle, BS, Exercise Physiologist;Joseph Hood RCP,RRT,BSRT    Virtual Visit No    Medication changes reported     No    Fall or balance concerns reported    No    Warm-up and Cool-down Performed on first and last piece of equipment    Resistance Training Performed Yes    VAD Patient? No    PAD/SET Patient? No      Pain Assessment   Currently in Pain? No/denies                Social History   Tobacco Use  Smoking Status Former   Current packs/day: 0.00   Types: Cigarettes   Quit date: 09/07/1980   Years since quitting: 43.1  Smokeless Tobacco Not on file    Goals Met:  Independence with exercise equipment Exercise tolerated well Personal goals reviewed No report of concerns or symptoms today  Goals Unmet:  Not Applicable  Comments: Pt able to follow exercise prescription today without complaint.   Devin Duke graduated today from  rehab with 35 sessions completed.  Details of the patient's exercise prescription and what He needs to do in order to continue the prescription and progress were discussed with patient.  Patient was given a copy of prescription and goals.  Patient verbalized understanding. Devin Duke plans to continue to exercise by at fitness center.   Dr. Firman Hughes is Medical Director for [facility] Cardiac Rehabilitation.  Dr. Fuad Aleskerov is Medical Director  for [facility] Pulmonary Rehabilitation.

## 2023-10-24 NOTE — Progress Notes (Signed)
 Discharge Note for  Devin Duke     1945/11/12         Devin Duke graduated today from  rehab with 35 sessions completed.  Details of the patient's exercise prescription and what Devin Duke needs to do in order to continue the prescription and progress were discussed with patient.  Patient was given a copy of prescription and goals.  Patient verbalized understanding. [name] plans to continue to exercise by at fitness center.    6 Minute Walk     Row Name 08/27/23 1550 10/17/23 0932       6 Minute Walk   Phase Initial Discharge    Distance 1145 feet 1450 feet    Distance % Change -- 26.6 %    Distance Feet Change -- 305 ft    Walk Time 6 minutes 6 minutes    # of Rest Breaks 0 0    MPH 2.17 2.75    METS 2.54 3.23    RPE 12 11    Perceived Dyspnea  1 0    VO2 Peak 8.88 11.31    Symptoms No No    Resting HR 56 bpm 72 bpm    Resting BP 116/68 124/66    Resting Oxygen Saturation  97 % 95 %    Exercise Oxygen Saturation  during 6 min walk 96 % 97 %    Max Ex. HR 102 bpm 115 bpm    Max Ex. BP 138/70 140/68    2 Minute Post BP 132/66 --

## 2023-10-24 NOTE — Progress Notes (Signed)
 Cardiac Individual Treatment Plan  Patient Details  Name: BRODEN HOLT MRN: [id number] Date of Birth: Jan 11, 1946 Referring Provider:   Flowsheet Row Cardiac Rehab from 08/27/2023 in [facility] Cardiac and Pulmonary Rehab  Referring Provider Dr. Eddy Goodell, MD       Initial Encounter Date:  Flowsheet Row Cardiac Rehab from 08/27/2023 in [facility] Cardiac and Pulmonary Rehab  Date 08/27/23       Visit Diagnosis: S/P CABG x 3  Patient's Home Medications on Admission:  Current Outpatient Medications:    amiodarone (PACERONE) 200 MG tablet, Take by mouth., Disp: , Rfl:    amLODipine (NORVASC) 10 MG tablet, Take by mouth. (Patient not taking: Reported on 08/20/2023), Disp: , Rfl:    amLODipine-benazepril (LOTREL) 5-20 MG capsule, 1 capsule Orally Once a day for 90 (Patient not taking: Reported on 08/20/2023), Disp: , Rfl:    amLODipine-benazepril (LOTREL) 5-20 MG per capsule, Take 1 capsule by mouth every morning.  (Patient not taking: Reported on 08/20/2023), Disp: , Rfl:    aspirin EC 81 MG tablet, Take by mouth., Disp: , Rfl:    furosemide (LASIX) 20 MG tablet, Take by mouth., Disp: , Rfl:    isosorbide mononitrate (IMDUR) 30 MG 24 hr tablet, Take 30 mg by mouth daily., Disp: , Rfl:    metoprolol  tartrate (LOPRESSOR ) 25 MG tablet, Take by mouth., Disp: , Rfl:    Multiple Vitamins-Minerals (PRESERVISION AREDS 2 PO), Take by mouth., Disp: , Rfl:    nitroGLYCERIN  (NITROSTAT ) 0.4 MG SL tablet, Place under the tongue., Disp: , Rfl:    potassium chloride SA (KLOR-CON M) 20 MEQ tablet, Take by mouth., Disp: , Rfl:    rosuvastatin (CRESTOR) 40 MG tablet, Take 40 mg by mouth daily., Disp: , Rfl:   Past Medical History: Past Medical History:  Diagnosis Date   Colon polyp    GERD (gastroesophageal reflux disease)    History of kidney stones    x1    Hypertension    Prostate CA (HCC)    PSA elevation    slow [facility]   Shoulder arthritis     limited ROM/pain   Tinnitus     Tobacco Use: Social History   Tobacco Use  Smoking Status Former   Current packs/day: 0.00   Types: Cigarettes   Quit date: 09/07/1980   Years since quitting: 43.1  Smokeless Tobacco Not on file    Labs: Review Flowsheet        No data to display           Exercise Target Goals: Exercise Program Goal: Individual exercise prescription set using results from initial 6 min walk test and THRR while considering  patient's activity barriers and safety.   Exercise Prescription Goal: Initial exercise prescription builds to 30-45 minutes a day of aerobic activity, 2-3 days per week.  Home exercise guidelines will be given to patient during program as part of exercise prescription that the participant will acknowledge.   Education: Aerobic Exercise: - Group verbal and visual presentation on the components of exercise prescription. Introduces F.I.T.T principle from ACSM for exercise prescriptions.  Reviews F.I.T.T. principles of aerobic exercise including progression. Written material given at graduation. Flowsheet Row Cardiac Rehab from 10/24/2023 in [facility] Cardiac and Pulmonary Rehab  Education need identified 08/29/23  Date 10/24/23  Educator NT  Instruction Review Code 1- Verbalizes Understanding       Education: Resistance Exercise: - Group verbal and visual presentation on the components  of exercise prescription. Introduces F.I.T.T principle from ACSM for exercise prescriptions  Reviews F.I.T.T. principles of resistance exercise including progression. Written material given at graduation.    Education: Exercise & Equipment Safety: - Individual verbal instruction and demonstration of equipment use and safety with use of the equipment. Flowsheet Row Cardiac Rehab from 10/24/2023 in [facility] Cardiac and Pulmonary Rehab  Date 08/20/23  Educator [facility]  Instruction Review Code 1- Verbalizes Understanding       Education: Exercise Physiology &  General Exercise Guidelines: - Group verbal and written instruction with models to review the exercise physiology of the cardiovascular system and associated critical values. Provides general exercise guidelines with specific guidelines to those with heart or lung disease.  Flowsheet Row Cardiac Rehab from 10/24/2023 in [facility] Cardiac and Pulmonary Rehab  Date 10/10/23  Educator NT  Instruction Review Code 1- [employer] Understanding       Education: Flexibility, Balance, Mind/Body Relaxation: - Group verbal and visual presentation with interactive activity on the components of exercise prescription. Introduces F.I.T.T principle from ACSM for exercise prescriptions. Reviews F.I.T.T. principles of flexibility and balance exercise training including progression. Also discusses the mind body connection.  Reviews various relaxation techniques to help reduce and manage stress (i.e. Deep breathing, progressive muscle relaxation, and visualization). Balance handout provided to take home. Written material given at graduation. Flowsheet Row Cardiac Rehab from 10/24/2023 in [facility] Cardiac and Pulmonary Rehab  Date 10/17/23  Educator NT  Instruction Review Code 1- Verbalizes Understanding       Activity Barriers & Risk Stratification:  Activity Barriers & Cardiac Risk Stratification - 08/27/23 1551       Activity Barriers & Cardiac Risk Stratification   Activity Barriers Joint Problems   L shoulder pain, limited ROM   Cardiac Risk Stratification High             6 Minute Walk:  6 Minute Walk     Row Name 08/27/23 1550 10/17/23 0932       6 Minute Walk   Phase Initial Discharge    Distance 1145 feet 1450 feet    Distance % Change -- 26.6 %    Distance Feet Change -- 305 ft    Walk Time 6 minutes 6 minutes    # of Rest Breaks 0 0    MPH 2.17 2.75    METS 2.54 3.23    RPE 12 11    Perceived Dyspnea  1 0    VO2 Peak 8.88 11.31    Symptoms No No    Resting HR 56 bpm 72 bpm    Resting  BP 116/68 124/66    Resting Oxygen Saturation  97 % 95 %    Exercise Oxygen Saturation  during 6 min walk 96 % 97 %    Max Ex. HR 102 bpm 115 bpm    Max Ex. BP 138/70 140/68    2 Minute Post BP 132/66 --             Oxygen Initial Assessment:   Oxygen Re-Evaluation:   Oxygen Discharge (Final Oxygen Re-Evaluation):   Initial Exercise Prescription:  Initial Exercise Prescription - 08/27/23 1500       Date of Initial Exercise RX and Referring Provider   Date 08/27/23    Referring Provider Dr. Eddy Goodell, MD      Oxygen   Maintain Oxygen Saturation 88% or higher      Treadmill   MPH 2    Grade 0.5  Minutes 15    METs 2.67      Recumbant Bike   Level 2    RPM 50    Watts 14    Minutes 15    METs 2.54      NuStep   Level 2    SPM 80    Minutes 15    METs 2.54      T5 Nustep   Level 1    SPM 80    Minutes 15    METs 2.54      Prescription Details   Frequency (times per week) 3    Duration Progress to 30 minutes of continuous aerobic without signs/symptoms of physical distress      Intensity   THRR 40-80% of Max Heartrate 90-124    Ratings of Perceived Exertion 11-13    Perceived Dyspnea 0-4      Progression   Progression Continue to progress workloads to maintain intensity without signs/symptoms of physical distress.      Resistance Training   Training Prescription Yes    Weight 4 lb    Reps 10-15             Perform Capillary Blood Glucose checks as needed.  Exercise Prescription Changes:   Exercise Prescription Changes     Row Name 08/27/23 1500 09/04/23 1300 09/21/23 0800 10/02/23 1400 10/08/23 0900     Response to Exercise   Blood Pressure (Admit) 116/68 126/68 128/62 118/58 --   Blood Pressure (Exercise) 138/70 128/64 144/62 138/60 --   Blood Pressure (Exit) 132/66 110/60 108/60 122/60 --   Heart Rate (Admit) 56 bpm 75 bpm 59 bpm 81 bpm --   Heart Rate (Exercise) 102 bpm 109 bpm 120 bpm 130 bpm --   Heart Rate (Exit) 70  bpm 75 bpm 83 bpm 84 bpm --   Oxygen Saturation (Admit) 97 % -- -- -- --   Oxygen Saturation (Exercise) 96 % -- -- -- --   Rating of Perceived Exertion (Exercise) 12 13 13 13  --   Perceived Dyspnea (Exercise) 1 -- -- -- --   Symptoms none none none none --   Comments Results First two days of exercise -- -- --   Duration -- Continue with 30 min of aerobic exercise without signs/symptoms of physical distress. Continue with 30 min of aerobic exercise without signs/symptoms of physical distress. Continue with 30 min of aerobic exercise without signs/symptoms of physical distress. Continue with 30 min of aerobic exercise without signs/symptoms of physical distress.   Intensity -- THRR unchanged THRR unchanged THRR unchanged THRR unchanged     Progression   Progression -- Continue to progress workloads to maintain intensity without signs/symptoms of physical distress. Continue to progress workloads to maintain intensity without signs/symptoms of physical distress. Continue to progress workloads to maintain intensity without signs/symptoms of physical distress. Continue to progress workloads to maintain intensity without signs/symptoms of physical distress.   Average METs -- 2.9 3.04 3.84 3.84     Resistance Training   Training Prescription -- Yes Yes Yes Yes   Weight -- 4 lb 4 lb 7 lb 7 lb   Reps -- 10-15 10-15 10-15 10-15     Interval Training   Interval Training -- No No No No     Treadmill   MPH -- 2 3 3.2 3.2   Grade -- 0.5 0 3 3   Minutes -- 15 15 15 15    METs -- 2.67 3.3 4.77 4.77  Recumbant Bike   Level -- 2 6 7 7    Watts -- 14 22 22 22    Minutes -- 15 15 15 15    METs -- 2.52 2.82 2.8 2.8     NuStep   Level -- 5 5 5 5    Minutes -- 15 15 15 15    METs -- 4.4 4.6 5 5      T5 Nustep   Level -- 1 5 5 5    Minutes -- 15 15 15 15    METs -- 2 2.3 2.5 2.5     Biostep-RELP   Level -- -- -- 4 4   Minutes -- -- -- 15 15   METs -- -- -- 3 3     Home Exercise Plan   Plans  to continue exercise at -- -- -- -- Home (comment)  walk and use [facility]   Frequency -- -- -- -- Add 2 additional days to program exercise sessions.   Initial Home Exercises Provided -- -- -- -- 10/08/23     Oxygen   Maintain Oxygen Saturation -- 88% or higher 88% or higher 88% or higher 88% or higher    Row Name 10/18/23 1700             Response to Exercise   Blood Pressure (Admit) 118/58       Blood Pressure (Exit) 136/68       Heart Rate (Admit) 63 bpm       Heart Rate (Exercise) 131 bpm       Heart Rate (Exit) 79 bpm       Rating of Perceived Exertion (Exercise) 13       Symptoms none       Duration Continue with 30 min of aerobic exercise without signs/symptoms of physical distress.       Intensity THRR unchanged         Progression   Progression Continue to progress workloads to maintain intensity without signs/symptoms of physical distress.       Average METs 3.79         Resistance Training   Training Prescription Yes       Weight 8 lb       Reps 10-15         Interval Training   Interval Training No         Treadmill   MPH 3.3       Grade 1       Minutes 15       METs 3.98         Recumbant Bike   Level 5       Watts 37       Minutes 15       METs 3.37         NuStep   Level 5       Minutes 15       METs 3         REL-XR   Level 6       Minutes 15         T5 Nustep   Level 5       Minutes 15       METs 2.5         Biostep-RELP   Level 5       Minutes 15       METs 4.7         Home Exercise Plan   Plans to continue exercise  at Home (comment)  walk and use [facility]       Frequency Add 2 additional days to program exercise sessions.       Initial Home Exercises Provided 10/08/23         Oxygen   Maintain Oxygen Saturation 88% or higher                Exercise Comments:   Exercise Comments     Row Name 08/29/23 0935 10/24/23 0945         Exercise Comments First full day of exercise!   Patient was oriented to gym and equipment including functions, settings, policies, and procedures.  Patient's individual exercise prescription and treatment plan were reviewed.  All starting workloads were established based on the results of the 6 minute walk test done at initial orientation visit.  The plan for exercise progression was also introduced and progression will be customized based on patient's performance and goals. Brian graduated today from  rehab with 35 sessions completed.  Details of the patient's exercise prescription and what He needs to do in order to continue the prescription and progress were discussed with patient.  Patient was given a copy of prescription and goals.  Patient verbalized understanding. Belton plans to continue to exercise by at fitness center.               Exercise Goals and Review:   Exercise Goals     Row Name 08/27/23 1409             Exercise Goals   Increase Physical Activity Yes       Intervention Provide advice, education, support and counseling about physical activity/exercise needs.;Develop an individualized exercise prescription for aerobic and resistive training based on initial evaluation findings, risk stratification, comorbidities and participant's personal goals.       Expected Outcomes Short Term: Attend rehab on a regular basis to increase amount of physical activity.;Long Term: Exercising regularly at least 3-5 days a week.;Long Term: Add in home exercise to make exercise part of routine and to increase amount of physical activity.       Increase Strength and Stamina Yes       Intervention Develop an individualized exercise prescription for aerobic and resistive training based on initial evaluation findings, risk stratification, comorbidities and participant's personal goals.;Provide advice, education, support and counseling about physical activity/exercise needs.       Expected Outcomes Short Term: Increase workloads from initial  exercise prescription for resistance, speed, and METs.;Short Term: Perform resistance training exercises routinely during rehab and add in resistance training at home;Long Term: Improve cardiorespiratory fitness, muscular endurance and strength as measured by increased METs and functional capacity ( )       Able to understand and use rate of perceived exertion (RPE) scale Yes       Intervention Provide education and explanation on how to use RPE scale       Expected Outcomes Short Term: Able to use RPE daily in rehab to express subjective intensity level;Long Term:  Able to use RPE to guide intensity level when exercising independently       Able to understand and use Dyspnea scale Yes       Intervention Provide education and explanation on how to use Dyspnea scale       Expected Outcomes Short Term: Able to use Dyspnea scale daily in rehab to express subjective sense of shortness of breath during exertion;Long Term: Able to use Dyspnea scale  to guide intensity level when exercising independently       Knowledge and understanding of Target Heart Rate Range (THRR) Yes       Intervention Provide education and explanation of THRR including how the numbers were predicted and where they are located for reference       Expected Outcomes Short Term: Able to state/look up THRR;Short Term: Able to use daily as guideline for intensity in rehab;Long Term: Able to use THRR to govern intensity when exercising independently       Able to check pulse independently Yes       Intervention Provide education and demonstration on how to check pulse in carotid and radial arteries.;Review the importance of being able to check your own pulse for safety during independent exercise       Expected Outcomes Short Term: Able to explain why pulse checking is important during independent exercise;Long Term: Able to check pulse independently and accurately       Understanding of Exercise Prescription Yes       Intervention  Provide education, explanation, and written materials on patient's individual exercise prescription       Expected Outcomes Short Term: Able to explain program exercise prescription;Long Term: Able to explain home exercise prescription to exercise independently                Exercise Goals Re-Evaluation :  Exercise Goals Re-Evaluation     Row Name 08/29/23 [phone] 09/04/23 1348 09/21/23 0859 10/02/23 1406 10/08/23 0952     Exercise Goal Re-Evaluation   Exercise Goals Review Able to understand and use rate of perceived exertion (RPE) scale;Knowledge and understanding of Target Heart Rate Range (THRR);Able to understand and use Dyspnea scale;Understanding of Exercise Prescription Increase Physical Activity;Increase Strength and Stamina;Understanding of Exercise Prescription Increase Physical Activity;Increase Strength and Stamina;Understanding of Exercise Prescription Increase Physical Activity;Increase Strength and Stamina;Understanding of Exercise Prescription Increase Physical Activity;Able to understand and use rate of perceived exertion (RPE) scale;Knowledge and understanding of Target Heart Rate Range (THRR);Understanding of Exercise Prescription;Increase Strength and Stamina;Able to understand and use Dyspnea scale;Able to check pulse independently   Comments Reviewed RPE and dyspnea scale, THR and program prescription with pt today.  Pt voiced understanding and was given a copy of goals to take home. Siegfried Dress is off to a good start in the program. He did well on the treadmill at a speed of 2 mph with an incline of 0.5%. He also improved to level 5 on the T4 nustep and did well working at level 1 on the T5 nustep and level 2 on the recumbent bike. We will continue to monitor his progress in the program. Siegfried Dress is doing well in rehab. He was recently able to increase from level 1 to 5 on the T5 nustep. He was aso able to increase his speed on the treadmill from to . We will continue to monitor  his progress in the program. Siegfried Dress continues to do well in rehab. He increased his treadmill workload to a speed of 3.2 mph with an incline of 3%. He also improved to level 7 on the recumbent bike and continues to use 7 lb hand weights for resistance training. We will continue to monitor his progress in the program. Reviewed home exercise with pt today.  Pt plans to walk and use the [facility] fitness center for exercise.  Reviewed THR, pulse, RPE, sign and symptoms, pulse oximetery and when to call 911 or MD.  Also discussed weather considerations and  indoor options.  Pt voiced understanding.   Expected Outcomes Short: Use RPE daily to regulate intensity. Long: Follow program prescription in THR. Short: Continue to follow current exercise prescription. Long: Continue exercise to improve strength and stamina. Short: Continue to increase treadmill workload. Long: Continue exercise to improve strength and stamina. Short: Continue to progressively increase treadmill workload. Long: Continue exercise to improve strength and stamina. Short: add 1-2 days a week of exercise on off days of rehab. Long: maintain independent exercise routine.    Row Name 10/18/23 1755             Exercise Goal Re-Evaluation   Exercise Goals Review Increase Physical Activity;Understanding of Exercise Prescription;Increase Strength and Stamina       Comments Siegfried Dress continues to do well in rehab. He is due for his post in the next review period and hopes to improve. He also increased to level 5 on the biostep, increased his speed on the treadmill to 3. , and increased to 8 lb handweights. We will continue to monitor his progress in the program.       Expected Outcomes Short: Improve on post . Long: Continue to increase overall METs and stamina.                Discharge Exercise Prescription (Final Exercise Prescription Changes):  Exercise Prescription Changes - 10/18/23 1700       Response to Exercise   Blood  Pressure (Admit) 118/58    Blood Pressure (Exit) 136/68    Heart Rate (Admit) 63 bpm    Heart Rate (Exercise) 131 bpm    Heart Rate (Exit) 79 bpm    Rating of Perceived Exertion (Exercise) 13    Symptoms none    Duration Continue with 30 min of aerobic exercise without signs/symptoms of physical distress.    Intensity THRR unchanged      Progression   Progression Continue to progress workloads to maintain intensity without signs/symptoms of physical distress.    Average METs 3.79      Resistance Training   Training Prescription Yes    Weight 8 lb    Reps 10-15      Interval Training   Interval Training No      Treadmill   MPH 3.3    Grade 1    Minutes 15    METs 3.98      Recumbant Bike   Level 5    Watts 37    Minutes 15    METs 3.37      NuStep   Level 5    Minutes 15    METs 3      REL-XR   Level 6    Minutes 15      T5 Nustep   Level 5    Minutes 15    METs 2.5      Biostep-RELP   Level 5    Minutes 15    METs 4.7      Home Exercise Plan   Plans to continue exercise at Home (comment)   walk and use [facility]   Frequency Add 2 additional days to program exercise sessions.    Initial Home Exercises Provided 10/08/23      Oxygen   Maintain Oxygen Saturation 88% or higher             Nutrition:  Target Goals: Understanding of nutrition guidelines, daily intake of sodium 1500mg , cholesterol 200mg , calories 30% from fat and 7% or less  from saturated fats, daily to have 5 or more servings of fruits and vegetables.  Education: All About Nutrition: -Group instruction provided by verbal, written material, interactive activities, discussions, models, and posters to present general guidelines for heart healthy nutrition including fat, fiber, MyPlate, the role of sodium in heart healthy nutrition, utilization of the nutrition label, and utilization of this knowledge for meal planning. Follow up email sent as well. Written material given  at graduation. Flowsheet Row Cardiac Rehab from 10/24/2023 in [facility] Cardiac and Pulmonary Rehab  Date 08/29/23  Educator JG part 2  Instruction Review Code 1- Verbalizes Understanding       Biometrics:  Pre Biometrics - 08/27/23 1551       Pre Biometrics   Height 6' 1.5" (1.867 m)    Weight 186 lb 4.8 oz (84.5 kg)    Waist Circumference 33 inches    Hip Circumference 39.5 inches    Waist to Hip Ratio 0.84 %    BMI (Calculated) 24.24    Single Leg Stand 21.2 seconds             Post Biometrics - 10/17/23 0935        Post  Biometrics   Height 6' 1.5" (1.867 m)    Weight 184 lb 9.6 oz (83.7 kg)    Waist Circumference 33 inches    Hip Circumference 41 inches    Waist to Hip Ratio 0.8 %    BMI (Calculated) 24.02    Single Leg Stand 10.4 seconds             Nutrition Therapy Plan and Nutrition Goals:  Nutrition Therapy & Goals - 09/24/23 1426       Nutrition Therapy   Diet Cardiac, Low Na    Protein (specify units) 80    Fiber 30 grams    Whole Grain Foods 3 servings    Saturated Fats 15 max. grams    Fruits and Vegetables 5 servings/day    Sodium 2 grams      Personal Nutrition Goals   Nutrition Goal Include protein at meals    Personal Goal #2 Drink 48-64oz of water    Personal Goal #3 Include a serving of fruit each day    Comments Patient drinking mostly water, but not much of it. Says he has been working on drinking more, currently ~32oz per day. Set goal to drink 48-64oz daily. His wife is nutrition conscious, saying that she keeps him following a heart healthy diet. He doesn't read labels, but tries not to eat much processed foods, instead his wife provides him lots of fruits, veggies, and whole grains. He says he could do better with eating fruit. Reviewed mediterranean diet handout. Educated on types of fats, sources, and how to read labels. Provided balanced plates handout, recommended he eat more protein than his recall suggested. Brainstormed ways to  include more protein at meals and snacks.      Intervention Plan   Intervention Prescribe, educate and counsel regarding individualized specific dietary modifications aiming towards targeted core components such as weight, hypertension, lipid management, diabetes, heart failure and other comorbidities.;Nutrition handout(s) given to patient.    Expected Outcomes Short Term Goal: Understand basic principles of dietary content, such as calories, fat, sodium, cholesterol and nutrients.;Short Term Goal: A plan has been developed with personal nutrition goals set during dietitian appointment.;Long Term Goal: Adherence to prescribed nutrition plan.             Nutrition Assessments:  MEDIFICTS  Score Key: >=70 Need to make dietary changes  40-70 Heart Healthy Diet <= 40 Therapeutic Level Cholesterol Diet  Flowsheet Row Cardiac Rehab from 08/29/2023 in [facility] Cardiac and Pulmonary Rehab  Picture Your Plate Total Score on Admission 72      Picture Your Plate Scores: <10 Unhealthy dietary pattern with much room for improvement. 41-50 Dietary pattern unlikely to meet recommendations for good health and room for improvement. 51-60 More healthful dietary pattern, with some room for improvement.  >60 Healthy dietary pattern, although there may be some specific behaviors that could be improved.    Nutrition Goals Re-Evaluation:  Nutrition Goals Re-Evaluation     Row Name 09/17/23 0946 10/12/23 0951           Goals   Comment meet with RD to establish goals. Appointment scheduled for 09/24/23 Siegfried Dress states that he is still working on dietary patterns discussed with the RD. He reports that he is drinking more water but still not quite meeting the recommendations. He is doing well with protein intake and limiting sodium intake.      Expected Outcome Short: meet with RD to establish nutrition goals. Long: maintain heart healthy diet. Short: Continue to work on drinking more water. Long: maintain heart  healthy diet.               Nutrition Goals Discharge (Final Nutrition Goals Re-Evaluation):  Nutrition Goals Re-Evaluation - 10/12/23 0951       Goals   Comment Siegfried Dress states that he is still working on dietary patterns discussed with the RD. He reports that he is drinking more water but still not quite meeting the recommendations. He is doing well with protein intake and limiting sodium intake.    Expected Outcome Short: Continue to work on drinking more water. Long: maintain heart healthy diet.             Psychosocial: Target Goals: Acknowledge presence or absence of significant depression and/or stress, maximize coping skills, provide positive support system. Participant is able to verbalize types and ability to use techniques and skills needed for reducing stress and depression.   Education: Stress, Anxiety, and Depression - Group verbal and visual presentation to define topics covered.  Reviews how body is impacted by stress, anxiety, and depression.  Also discusses healthy ways to reduce stress and to treat/manage anxiety and depression.  Written material given at graduation. Flowsheet Row Cardiac Rehab from 10/24/2023 in [facility] Cardiac and Pulmonary Rehab  Date 10/03/23  Educator [facility]  Instruction Review Code 1- [employer] Understanding       Education: Sleep Hygiene -Provides group verbal and written instruction about how sleep can affect your health.  Define sleep hygiene, discuss sleep cycles and impact of sleep habits. Review good sleep hygiene tips.    Initial Review & Psychosocial Screening:  Initial Psych Review & Screening - 08/20/23 1018       Initial Review   Current issues with None Identified      Family Dynamics   Good Support System? Yes    Comments Cindy can look to his wife and son for support. He reports no mental instabilities and does not take anything for his mood.      Barriers   Psychosocial barriers to participate in program The patient  should benefit from training in stress management and relaxation.;There are no identifiable barriers or psychosocial needs.      Screening Interventions   Interventions Encouraged to exercise;To provide support and resources with identified psychosocial  needs;Provide feedback about the scores to participant;Program counselor consult    Expected Outcomes Short Term goal: Utilizing psychosocial counselor, staff and physician to assist with identification of specific Stressors or current issues interfering with healing process. Setting desired goal for each stressor or current issue identified.;Long Term Goal: Stressors or current issues are controlled or eliminated.;Short Term goal: Identification and review with participant of any Quality of Life or Depression concerns found by scoring the questionnaire.;Long Term goal: The participant improves quality of Life and PHQ9 Scores as seen by post scores and/or verbalization of changes             Quality of Life Scores:   Quality of Life - 08/29/23 0945       Quality of Life   Select Quality of Life      Quality of Life Scores   Health/Function Pre 28.71 %    Socioeconomic Pre 30 %    Psych/Spiritual Pre 28.29 %    Family Pre 30 %    GLOBAL Pre 29.06 %            Scores of 19 and below usually indicate a poorer quality of life in these areas.  A difference of  2-3 points is a clinically meaningful difference.  A difference of 2-3 points in the total score of the Quality of Life Index has been associated with significant improvement in overall quality of life, self-image, physical symptoms, and general health in studies assessing change in quality of life.  PHQ-9: Review Flowsheet       08/27/2023  Depression screen PHQ 2/9  Decreased Interest 1  Down, Depressed, Hopeless 0  PHQ - 2 Score 1  Altered sleeping 0  Tired, decreased energy 1  Change in appetite 0  Feeling bad or failure about yourself  0  Trouble concentrating 0   Moving slowly or fidgety/restless 0  Suicidal thoughts 0  PHQ-9 Score 2  Difficult doing work/chores Not difficult at all   Interpretation of Total Score  Total Score Depression Severity:  1-4 = Minimal depression, 5-9 = Mild depression, 10-14 = Moderate depression, 15-19 = Moderately severe depression, 20-27 = Severe depression   Psychosocial Evaluation and Intervention:  Psychosocial Evaluation - 08/20/23 1019       Psychosocial Evaluation & Interventions   Interventions Encouraged to exercise with the program and follow exercise prescription;Relaxation education;Stress management education    Comments Devontae can look to his wife and son for support. He reports no mental instabilities and does not take anything for his mood.    Expected Outcomes Short: Start [facility] to help with mood. Long: Maintain a healthy mental state    Continue Psychosocial Services  Follow up required by staff             Psychosocial Re-Evaluation:  Psychosocial Re-Evaluation     Row Name 09/17/23 [id number] 10/12/23 0957           Psychosocial Re-Evaluation   Current issues with None Identified None Identified      Comments Siegfried Dress reports that he continues to have a positive attitdue. He reports that he has a good support system. He is looking forward to gaining more strength and getting though the healing process so that he can get back to playing golf. Siegfried Dress reports no major stressors at this time. He also states that he is sleeping well and getting 7-8 hours of sleep each night. He states thats his wife is a great support system for him. He  is looking forward to getting back to playing golf with his group of friends soon.      Expected Outcomes Short: get back to playing golf. Long: maintian good mental health routine. Short: Get back to playing golf. Long: Maintain positive outlook.      Interventions Encouraged to attend Cardiac Rehabilitation for the exercise Encouraged to attend Cardiac  Rehabilitation for the exercise      Continue Psychosocial Services  Follow up required by staff Follow up required by staff               Psychosocial Discharge (Final Psychosocial Re-Evaluation):  Psychosocial Re-Evaluation - 10/12/23 0957       Psychosocial Re-Evaluation   Current issues with None Identified    Comments Siegfried Dress reports no major stressors at this time. He also states that he is sleeping well and getting 7-8 hours of sleep each night. He states thats his wife is a great support system for him. He is looking forward to getting back to playing golf with his group of friends soon.    Expected Outcomes Short: Get back to playing golf. Long: Maintain positive outlook.    Interventions Encouraged to attend Cardiac Rehabilitation for the exercise    Continue Psychosocial Services  Follow up required by staff             Vocational Rehabilitation: Provide vocational rehab assistance to qualifying candidates.   Vocational Rehab Evaluation & Intervention:   Education: Education Goals: Education classes will be provided on a variety of topics geared toward better understanding of heart health and risk factor modification. Participant will state understanding/return demonstration of topics presented as noted by education test scores.  Learning Barriers/Preferences:  Learning Barriers/Preferences - 08/20/23 1018       Learning Barriers/Preferences   Learning Barriers None    Learning Preferences None             General Cardiac Education Topics:  AED/CPR: - Group verbal and written instruction with the use of models to demonstrate the basic use of the AED with the basic ABC's of resuscitation.   Anatomy and Cardiac Procedures: - Group verbal and visual presentation and models provide information about basic cardiac anatomy and function. Reviews the testing methods done to diagnose heart disease and the outcomes of the test results. Describes the treatment  choices: Medical Management, Angioplasty, or Coronary Bypass Surgery for treating various heart conditions including Myocardial Infarction, Angina, Valve Disease, and Cardiac Arrhythmias.  Written material given at graduation. Flowsheet Row Cardiac Rehab from 10/24/2023 in [facility] Cardiac and Pulmonary Rehab  Date 09/05/23  Educator sb  Instruction Review Code 1- Verbalizes Understanding       Medication Safety: - Group verbal and visual instruction to review commonly prescribed medications for heart and lung disease. Reviews the medication, class of the drug, and side effects. Includes the steps to properly store meds and maintain the prescription regimen.  Written material given at graduation. Flowsheet Row Cardiac Rehab from 10/24/2023 in [facility] Cardiac and Pulmonary Rehab  Date 09/12/23  Educator [facility]  Instruction Review Code 1- Verbalizes Understanding       Intimacy: - Group verbal instruction through game format to discuss how heart and lung disease can affect sexual intimacy. Written material given at graduation.. Flowsheet Row Cardiac Rehab from 10/24/2023 in [facility] Cardiac and Pulmonary Rehab  Date 10/24/23  Educator NT  Instruction Review Code 1- Verbalizes Understanding       Know Your Numbers and Heart Failure: -  Group verbal and visual instruction to discuss disease risk factors for cardiac and pulmonary disease and treatment options.  Reviews associated critical values for Overweight/Obesity, Hypertension, Cholesterol, and Diabetes.  Discusses basics of heart failure: signs/symptoms and treatments.  Introduces Heart Failure Zone chart for action plan for heart failure.  Written material given at graduation. Flowsheet Row Cardiac Rehab from 10/24/2023 in [facility] Cardiac and Pulmonary Rehab  Date 09/19/23  Educator [facility]  Instruction Review Code 1- Verbalizes Understanding       Infection Prevention: - Provides verbal and written material to individual with discussion of infection  control including proper hand washing and proper equipment cleaning during exercise session. Flowsheet Row Cardiac Rehab from 10/24/2023 in [facility] Cardiac and Pulmonary Rehab  Date 08/20/23  Educator [facility]  Instruction Review Code 1- Verbalizes Understanding       Falls Prevention: - Provides verbal and written material to individual with discussion of falls prevention and safety. Flowsheet Row Cardiac Rehab from 10/24/2023 in [facility] Cardiac and Pulmonary Rehab  Date 08/20/23  Educator [facility]  Instruction Review Code 1- Verbalizes Understanding       Other: -Provides group and verbal instruction on various topics (see comments)   Knowledge Questionnaire Score:  Knowledge Questionnaire Score - 08/29/23 0948       Knowledge Questionnaire Score   Pre Score 24/26             Core Components/Risk Factors/Patient Goals at Admission:  Personal Goals and Risk Factors at Admission - 08/20/23 1017       Core Components/Risk Factors/Patient Goals on Admission    Weight Management Yes;Weight Gain    Intervention Weight Management: Develop a combined nutrition and exercise program designed to reach desired caloric intake, while maintaining appropriate intake of nutrient and fiber, sodium and fats, and appropriate energy expenditure required for the weight goal.;Weight Management: Provide education and appropriate resources to help participant work on and attain dietary goals.;Weight Management/Obesity: Establish reasonable short term and long term weight goals.    Expected Outcomes Short Term: Continue to assess and modify interventions until short term weight is achieved;Weight Maintenance: Understanding of the daily nutrition guidelines, which includes 25-35% calories from fat, 7% or less cal from saturated fats, less than 200mg  cholesterol, less than 1.5gm of sodium, & 5 or more servings of fruits and vegetables daily;Understanding recommendations for meals to include 15-35% energy as protein,  25-35% energy from fat, 35-60% energy from carbohydrates, less than 200mg  of dietary cholesterol, 20-35 gm of total fiber daily;Understanding of distribution of calorie intake throughout the day with the consumption of 4-5 meals/snacks    Hypertension Yes    Intervention Provide education on lifestyle modifcations including regular physical activity/exercise, weight management, moderate sodium restriction and increased consumption of fresh fruit, vegetables, and low fat dairy, alcohol moderation, and smoking cessation.;Monitor prescription use compliance.    Expected Outcomes Short Term: Continued assessment and intervention until BP is < 140/40mm HG in hypertensive participants. < 130/72mm HG in hypertensive participants with diabetes, heart failure or chronic kidney disease.;Long Term: Maintenance of blood pressure at goal levels.    Lipids Yes    Intervention Provide education and support for participant on nutrition & aerobic/resistive exercise along with prescribed medications to achieve LDL 70mg , HDL >40mg .    Expected Outcomes Short Term: Participant states understanding of desired cholesterol values and is compliant with medications prescribed. Participant is following exercise prescription and nutrition guidelines.;Long Term: Cholesterol controlled with medications as prescribed, with individualized exercise RX and with personalized  nutrition plan. Value goals: LDL < 70mg , HDL > 40 mg.             Education:Diabetes - Individual verbal and written instruction to review signs/symptoms of diabetes, desired ranges of glucose level fasting, after meals and with exercise. Acknowledge that pre and post exercise glucose checks will be done for 3 sessions at entry of program.   Core Components/Risk Factors/Patient Goals Review:   Goals and Risk Factor Review     Row Name 09/17/23 0941 10/12/23 0953           Core Components/Risk Factors/Patient Goals Review   Personal Goals Review  Hypertension;Lipids Hypertension;Weight Management/Obesity      Review Patient reports that he takes all BP and cholesterol meds and follows up with his doctor regulary. He is currently seeing his PCP as follow up and is getting established with a cardiologist in May. He checks is BP at home every day. Siegfried Dress states that his BP has been within normal ranges. He owns a BP cuff and checks it every morning. He visited the cardiologist yesterday and reports that she was very pleased with his progress and healing. He also states that he is comfortable with where his weight is at this time and is working on maintaining his weight at 185 lbs.      Expected Outcomes Short: get established with cardiologist. Long: continue to monitor and control cardiac risk factors. Short: Continue to check BP at home. Long: continue to manage lifestyle risk factors.               Core Components/Risk Factors/Patient Goals at Discharge (Final Review):   Goals and Risk Factor Review - 10/12/23 0953       Core Components/Risk Factors/Patient Goals Review   Personal Goals Review Hypertension;Weight Management/Obesity    Review Siegfried Dress states that his BP has been within normal ranges. He owns a BP cuff and checks it every morning. He visited the cardiologist yesterday and reports that she was very pleased with his progress and healing. He also states that he is comfortable with where his weight is at this time and is working on maintaining his weight at 185 lbs.    Expected Outcomes Short: Continue to check BP at home. Long: continue to manage lifestyle risk factors.             ITP Comments:  ITP Comments     Row Name 08/20/23 1017 08/27/23 1407 08/29/23 0935 09/05/23 1126 10/03/23 1303   ITP Comments Virtual Visit completed. Patient informed on EP and RD appointment and 6 Minute walk test. Patient also informed of patient health questionnaires on My Chart. Patient Verbalizes understanding. Visit diagnosis can be  found in [facility]. Completed and gym orientation. Initial ITP created and sent for review to Dr. Firman Hughes, Medical Director. First full day of exercise!  Patient was oriented to gym and equipment including functions, settings, policies, and procedures.  Patient's individual exercise prescription and treatment plan were reviewed.  All starting workloads were established based on the results of the 6 minute walk test done at initial orientation visit.  The plan for exercise progression was also introduced and progression will be customized based on patient's performance and goals. 30 Day review completed. Medical Director ITP review done, changes made as directed, and signed approval by Medical Director.    new to program 30 Day review completed. Medical Director ITP review done, changes made as directed, and signed approval by Medical Director.  Row Name 10/24/23 0945           ITP Comments Durrell graduated today from  rehab with 35 sessions completed.  Details of the patient's exercise prescription and what He needs to do in order to continue the prescription and progress were discussed with patient.  Patient was given a copy of prescription and goals.  Patient verbalized understanding. Shrey plans to continue to exercise by at fitness center.                Comments: Discharge ITP

## 2023-10-26 ENCOUNTER — Encounter

## 2023-10-29 ENCOUNTER — Encounter

## 2023-10-31 ENCOUNTER — Encounter

## 2023-11-02 ENCOUNTER — Encounter

## 2023-11-05 ENCOUNTER — Encounter

## 2023-11-07 ENCOUNTER — Encounter

## 2023-11-09 ENCOUNTER — Encounter

## 2023-11-12 ENCOUNTER — Encounter

## 2023-11-14 ENCOUNTER — Encounter

## 2023-11-16 ENCOUNTER — Encounter

## 2023-11-19 ENCOUNTER — Encounter

## 2023-11-21 ENCOUNTER — Encounter

## 2023-11-23 ENCOUNTER — Encounter

## 2023-11-26 ENCOUNTER — Encounter
